# Patient Record
Sex: Male | Born: 1975
Health system: Southern US, Community
[De-identification: ages and names within clinical notes are randomized; demographics above are authoritative.]

## PROBLEM LIST (undated history)

## (undated) DIAGNOSIS — N200 Calculus of kidney: Secondary | ICD-10-CM

## (undated) DIAGNOSIS — Z720 Tobacco use: Secondary | ICD-10-CM

## (undated) DIAGNOSIS — F1010 Alcohol abuse, uncomplicated: Secondary | ICD-10-CM

## (undated) DIAGNOSIS — F32A Depression, unspecified: Secondary | ICD-10-CM

## (undated) HISTORY — PX: KIDNEY STONE SURGERY: SHX686

## (undated) HISTORY — DX: Alcohol abuse, uncomplicated: F10.10

## (undated) HISTORY — DX: Tobacco use: Z72.0

## (undated) HISTORY — DX: Depression, unspecified: F32.A

---

## 2015-08-18 ENCOUNTER — Ambulatory Visit (INDEPENDENT_AMBULATORY_CARE_PROVIDER_SITE_OTHER): Payer: BLUE CROSS/BLUE SHIELD | Admitting: Family Medicine

## 2015-08-18 ENCOUNTER — Encounter: Payer: Self-pay | Admitting: Family Medicine

## 2015-08-18 VITALS — BP 132/82 | HR 87 | Wt 159.0 lb

## 2015-08-18 DIAGNOSIS — F329 Major depressive disorder, single episode, unspecified: Secondary | ICD-10-CM

## 2015-08-18 DIAGNOSIS — F32A Depression, unspecified: Secondary | ICD-10-CM | POA: Insufficient documentation

## 2015-08-18 DIAGNOSIS — Z23 Encounter for immunization: Secondary | ICD-10-CM | POA: Diagnosis not present

## 2015-08-18 DIAGNOSIS — F325 Major depressive disorder, single episode, in full remission: Secondary | ICD-10-CM | POA: Insufficient documentation

## 2015-08-18 DIAGNOSIS — F411 Generalized anxiety disorder: Secondary | ICD-10-CM | POA: Insufficient documentation

## 2015-08-18 MED ORDER — SERTRALINE HCL 25 MG PO TABS: ORAL_TABLET | ORAL | Status: DC

## 2015-08-18 MED ORDER — TRAZODONE HCL 50 MG PO TABS: 25.0000 mg | ORAL_TABLET | Freq: Every evening | ORAL | Status: DC | PRN

## 2015-08-18 NOTE — Progress Notes (Signed)
       Adam Norton is a 40 y.o. male who presents to [name]: Primary Care today for establish care and discuss anxiety and depression. Patient is having significant marital difficulties and over the past month or so has been feeling very anxious and depressed. He's been seeing a therapist recommends starting medications. He denies any SI or HI. He has never had problems with depression or anxiety before. Additionally he notes significant difficulty sleeping. He has a history of alcohol problems in the past but does not drink currently. He denies any other substance use. No fevers chills nausea vomiting diarrhea chest pain palpitations or shortness of breath.   History reviewed. No pertinent past medical history. Past Surgical History  Procedure Laterality Date  . Kidney stone surgery     Social History  Substance Use Topics  . Smoking status: Former Games developer  . Smokeless tobacco: Not on file  . Alcohol Use: 0.0 oz/week    0 Standard drinks or equivalent per week   family history includes Alcohol abuse in his brother and father; Diabetes in his father.  ROS as above Medications: Current Outpatient Prescriptions  Medication Sig Dispense Refill  . sertraline (ZOLOFT) 25 MG tablet 1 pill by mouth daily for 1 week then 2 pills by mouth daily until follow-up 30 tablet 0  . traZODone (DESYREL) 50 MG tablet Take 0.5-1 tablets (25-50 mg total) by mouth at bedtime as needed for sleep. 30 tablet 0   No current facility-administered medications for this visit.   No Known Allergies   Exam:  BP 132/82 mmHg  Pulse 87  Wt 159 lb (72.122 kg) Gen: Well NAD HEENT: EOMI,  MMM Lungs: Normal work of breathing. CTABL Heart: RRR no MRG Abd: NABS, Soft. Nondistended, Nontender Exts: Brisk capillary refill, warm and well perfused.  Psych: Alert and oriented normal speech thought process affect. No SI or HI  expressed.  PHQ9 is 9 GAD 7 is 8  No results found for this or any previous visit (from the past 24 hour(s)). No results found.   Tdap given prior to discharge.  Please see individual assessment and plan sections.

## 2015-08-18 NOTE — Addendum Note (Signed)
Addended by: Minna Antis T on: 08/18/2015 05:19 PM   Modules accepted: Orders

## 2015-08-18 NOTE — Assessment & Plan Note (Signed)
Start Zoloft follow-up one week. Check routine fasting blood work

## 2015-08-18 NOTE — Patient Instructions (Signed)
Thank you for coming in today. Return in one week Start taking Zoloft. Take 1 pill by mouth daily. In one week increase to 2 pills by mouth daily. Take trazodone at bedtime as needed for insomnia or difficulty sleeping. Continue seeing your therapist Get fasting labs in the near future Call if you get worse  Major Depressive Disorder Major depressive disorder is a mental illness. It also may be called clinical depression or unipolar depression. Major depressive disorder usually causes feelings of sadness, hopelessness, or helplessness. Some people with this disorder do not feel particularly sad but lose interest in doing things they used to enjoy (anhedonia). Major depressive disorder also can cause physical symptoms. It can interfere with work, school, relationships, and other normal everyday activities. The disorder varies in severity but is longer lasting and more serious than the sadness we all feel from time to time in our lives. Major depressive disorder often is triggered by stressful life events or major life changes. Examples of these triggers include divorce, loss of your job or home, a move, and the death of a family member or close friend. Sometimes this disorder occurs for no obvious reason at all. People who have family members with major depressive disorder or bipolar disorder are at higher risk for developing this disorder, with or without life stressors. Major depressive disorder can occur at any age. It may occur just once in your life (single episode major depressive disorder). It may occur multiple times (recurrent major depressive disorder). SYMPTOMS People with major depressive disorder have either anhedonia or depressed mood on nearly a daily basis for at least 2 weeks or longer. Symptoms of depressed mood include:  Feelings of sadness (blue or down in the dumps) or emptiness.  Feelings of hopelessness or helplessness.  Tearfulness or episodes of crying (may be observed by  others).  Irritability (children and adolescents). In addition to depressed mood or anhedonia or both, people with this disorder have at least four of the following symptoms:  Difficulty sleeping or sleeping too much.   Significant change (increase or decrease) in appetite or weight.   Lack of energy or motivation.  Feelings of guilt and worthlessness.   Difficulty concentrating, remembering, or making decisions.  Unusually slow movement (psychomotor retardation) or restlessness (as observed by others).   Recurrent wishes for death, recurrent thoughts of self-harm (suicide), or a suicide attempt. People with major depressive disorder commonly have persistent negative thoughts about themselves, other people, and the world. People with severe major depressive disorder may experiencedistorted beliefs or perceptions about the world (psychotic delusions). They also may see or hear things that are not real (psychotic hallucinations). DIAGNOSIS Major depressive disorder is diagnosed through an assessment by your health care provider. Your health care provider will ask aboutaspects of your daily life, such as mood,sleep, and appetite, to see if you have the diagnostic symptoms of major depressive disorder. Your health care provider may ask about your medical history and use of alcohol or drugs, including prescription medicines. Your health care provider also may do a physical exam and blood work. This is because certain medical conditions and the use of certain substances can cause major depressive disorder-like symptoms (secondary depression). Your health care provider also may refer you to a mental health specialist for further evaluation and treatment. TREATMENT It is important to recognize the symptoms of major depressive disorder and seek treatment. The following treatments can be prescribed for this disorder:   Medicine. Antidepressant medicines usually are prescribed. Antidepressant  medicines are thought to correct chemical imbalances in the brain that are commonly associated with major depressive disorder. Other types of medicine may be added if the symptoms do not respond to antidepressant medicines alone or if psychotic delusions or hallucinations occur.  Talk therapy. Talk therapy can be helpful in treating major depressive disorder by providing support, education, and guidance. Certain types of talk therapy also can help with negative thinking (cognitive behavioral therapy) and with relationship issues that trigger this disorder (interpersonal therapy). A mental health specialist can help determine which treatment is best for you. Most people with major depressive disorder do well with a combination of medicine and talk therapy. Treatments involving electrical stimulation of the brain can be used in situations with extremely severe symptoms or when medicine and talk therapy do not work over time. These treatments include electroconvulsive therapy, transcranial magnetic stimulation, and vagal nerve stimulation.   This information is not intended to replace advice given to you by your health care provider. Make sure you discuss any questions you have with your health care provider.   Document Released: 10/30/2012 Document Revised: 07/26/2014 Document Reviewed: 10/30/2012 Elsevier Interactive Patient Education [employer]

## 2015-08-22 LAB — CBC
HCT: 42.3 % (ref 39.0–52.0)
HEMOGLOBIN: 13.8 g/dL (ref 13.0–17.0)
MCH: 28.2 pg (ref 26.0–34.0)
MCHC: 32.6 g/dL (ref 30.0–36.0)
MCV: 86.5 fL (ref 78.0–100.0)
MPV: 8.6 fL (ref 8.6–12.4)
Platelets: 502 10*3/uL — ABNORMAL HIGH (ref 150–400)
RBC: 4.89 MIL/uL (ref 4.22–5.81)
RDW: 14.5 % (ref 11.5–15.5)
WBC: 7.9 10*3/uL (ref 4.0–10.5)

## 2015-08-23 LAB — COMPREHENSIVE METABOLIC PANEL
ALBUMIN: 4.5 g/dL (ref 3.6–5.1)
ALT: 15 U/L (ref 9–46)
AST: 16 U/L (ref 10–40)
Alkaline Phosphatase: 76 U/L (ref 40–115)
BILIRUBIN TOTAL: 0.5 mg/dL (ref 0.2–1.2)
BUN: 19 mg/dL (ref 7–25)
CO2: 26 mmol/L (ref 20–31)
CREATININE: 0.81 mg/dL (ref 0.60–1.35)
Calcium: 9.7 mg/dL (ref 8.6–10.3)
Chloride: 103 mmol/L (ref 98–110)
Glucose, Bld: 89 mg/dL (ref 65–99)
Potassium: 4.3 mmol/L (ref 3.5–5.3)
SODIUM: 139 mmol/L (ref 135–146)
TOTAL PROTEIN: 7.5 g/dL (ref 6.1–8.1)

## 2015-08-23 LAB — LIPID PANEL
Cholesterol: 183 mg/dL (ref 125–200)
HDL: 45 mg/dL (ref >=40)
LDL CALC: 120 mg/dL (ref <130)
TRIGLYCERIDES: 89 mg/dL (ref <150)
Total CHOL/HDL Ratio: 4.1 Ratio (ref <=5.0)
VLDL: 18 mg/dL (ref <30)

## 2015-08-23 LAB — VITAMIN D 25 HYDROXY (VIT D DEFICIENCY, FRACTURES): Vit D, 25-Hydroxy: 25 ng/mL — ABNORMAL LOW (ref 30–100)

## 2015-08-23 LAB — TSH: TSH: 0.437 u[IU]/mL (ref 0.350–4.500)

## 2015-08-25 ENCOUNTER — Encounter: Payer: Self-pay | Admitting: Family Medicine

## 2015-08-25 ENCOUNTER — Ambulatory Visit (INDEPENDENT_AMBULATORY_CARE_PROVIDER_SITE_OTHER): Payer: BLUE CROSS/BLUE SHIELD | Admitting: Family Medicine

## 2015-08-25 VITALS — BP 129/67 | HR 88 | Wt 159.0 lb

## 2015-08-25 DIAGNOSIS — F32A Depression, unspecified: Secondary | ICD-10-CM

## 2015-08-25 DIAGNOSIS — F411 Generalized anxiety disorder: Secondary | ICD-10-CM | POA: Diagnosis not present

## 2015-08-25 DIAGNOSIS — G4700 Insomnia, unspecified: Secondary | ICD-10-CM

## 2015-08-25 DIAGNOSIS — F329 Major depressive disorder, single episode, unspecified: Secondary | ICD-10-CM | POA: Diagnosis not present

## 2015-08-25 DIAGNOSIS — F15982 Other stimulant use, unspecified with stimulant-induced sleep disorder: Secondary | ICD-10-CM | POA: Insufficient documentation

## 2015-08-25 MED ORDER — SERTRALINE HCL 50 MG PO TABS: ORAL_TABLET | ORAL | Status: DC

## 2015-08-25 NOTE — Progress Notes (Signed)
Quick Note:  Vitamin D deficiency noted. Take 2000 units of vitamin D daily over-the-counter.   ______ 

## 2015-08-25 NOTE — Patient Instructions (Signed)
Thank you for coming in today. Increase zoloft to 2 pills (50 mg) daily.  Take  daily for 2 weeks then increase to   Return in 3 weeks.  Continue counseling.

## 2015-08-26 NOTE — Progress Notes (Signed)
       Adam Norton is a 40 y.o. male who presents to [name]: Primary Care today for follow-up depression and anxiety. Patient was seen last week complaining of insomnia depression and anxiety. He was advised to counseling and was given a prescription for Zoloft and trazodone. He currently is taking 25 mg of Zoloft and using trazodone at night. He notes his anxiety and depression are only minimally improved. He has attended counseling which is somewhat helpful. He notes his sleep is slightly improved with trazodone. He denies any SI or HI.   No past medical history on file. Past Surgical History  Procedure Laterality Date  . Kidney stone surgery     Social History  Substance Use Topics  . Smoking status: Current Some Day Smoker  . Smokeless tobacco: Not on file  . Alcohol Use: 0.0 oz/week    0 Standard drinks or equivalent per week   family history includes Alcohol abuse in his brother and father; Diabetes in his father.  ROS as above Medications: Current Outpatient Prescriptions  Medication Sig Dispense Refill  . sertraline (ZOLOFT) 50 MG tablet 1 pill by mouth daily for 1 week then 2 pills by mouth daily until follow-up 30 tablet 0  . traZODone (DESYREL) 50 MG tablet Take 0.5-1 tablets (25-50 mg total) by mouth at bedtime as needed for sleep. 30 tablet 0   No current facility-administered medications for this visit.   No Known Allergies   Exam:  BP 129/67 mmHg  Pulse 88  Wt 159 lb (72.122 kg)  SpO2 97% Gen: Well NAD Psych: Alert and oriented normal speech and thought process. Affect is slightly tearful. No SI or HI expressed. GAD7 is 7 PHQ9 is 7.   No results found for this or any previous visit (from the past 24 hour(s)). No results found.   40 year old man with anxiety depression and insomnia. Continue counseling. Increase Zoloft to 50 mg. Self titrated 200 mg. Return in 3  weeks. Discuss suicidal precautions. Return sooner if needed.

## 2015-09-05 ENCOUNTER — Other Ambulatory Visit: Payer: Self-pay | Admitting: Family Medicine

## 2015-09-15 ENCOUNTER — Ambulatory Visit (INDEPENDENT_AMBULATORY_CARE_PROVIDER_SITE_OTHER): Payer: BLUE CROSS/BLUE SHIELD | Admitting: Family Medicine

## 2015-09-15 ENCOUNTER — Encounter: Payer: Self-pay | Admitting: Family Medicine

## 2015-09-15 VITALS — BP 121/71 | HR 66 | Wt 156.0 lb

## 2015-09-15 DIAGNOSIS — G4700 Insomnia, unspecified: Secondary | ICD-10-CM | POA: Diagnosis not present

## 2015-09-15 DIAGNOSIS — F32A Depression, unspecified: Secondary | ICD-10-CM

## 2015-09-15 DIAGNOSIS — F411 Generalized anxiety disorder: Secondary | ICD-10-CM

## 2015-09-15 DIAGNOSIS — F329 Major depressive disorder, single episode, unspecified: Secondary | ICD-10-CM | POA: Diagnosis not present

## 2015-09-15 MED ORDER — TRAZODONE HCL 100 MG PO TABS: 100.0000 mg | ORAL_TABLET | Freq: Every evening | ORAL | Status: DC | PRN

## 2015-09-15 MED ORDER — SERTRALINE HCL 100 MG PO TABS: 100.0000 mg | ORAL_TABLET | Freq: Every day | ORAL | Status: DC

## 2015-09-15 NOTE — Assessment & Plan Note (Signed)
increase trazodone to 100 mg daily at bedtime. If not improved would switch to Ambien CR

## 2015-09-15 NOTE — Progress Notes (Signed)
       Adam Norton is a 40 y.o. male who presents to [name]: Primary Care today for follow-up depression and anxiety and insomnia. About a month ago patient was first seen for severe depression and anxiety associated with insomnia. This is related to the breakup of his marriage. He currently is taking 100 mg of Zoloft and trazodone 50 mg at bedtime. Both medicines are helpful. He notes difficulty staying asleep. He notes continued depression and anxiety symptoms but denies any SI or HI. His wife has formally separated. He received counseling at church.   No past medical history on file. Past Surgical History  Procedure Laterality Date  . Kidney stone surgery     Social History  Substance Use Topics  . Smoking status: Current Some Day Smoker  . Smokeless tobacco: Not on file  . Alcohol Use: 0.0 oz/week    0 Standard drinks or equivalent per week   family history includes Alcohol abuse in his brother and father; Diabetes in his father.  ROS as above Medications: Current Outpatient Prescriptions  Medication Sig Dispense Refill  . sertraline (ZOLOFT) 100 MG tablet Take 1 tablet (100 mg total) by mouth daily. 30 tablet 1  . traZODone (DESYREL) 100 MG tablet Take 1 tablet (100 mg total) by mouth at bedtime as needed. for sleep 30 tablet 1   No current facility-administered medications for this visit.   No Known Allergies   Exam:  BP 121/71 mmHg  Pulse 66  Wt 156 lb (70.761 kg) Gen: Well NAD Psych: Alert and oriented normal speech thought process and affect. No SI or HI expressed. GAD 7 is 10 PHQ9 is 9  No results found for this or any previous visit (from the past 24 hour(s)). No results found.   Please see individual assessment and plan sections.

## 2015-09-15 NOTE — Assessment & Plan Note (Signed)
Continue Zoloft 100. Recheck in a few weeks.

## 2015-09-15 NOTE — Assessment & Plan Note (Signed)
Continue Zoloft 100. Recheck in a few weeks. 

## 2015-09-15 NOTE — Patient Instructions (Signed)
Thank you for coming in today. Take zoloft .  Take trazodone 50-100mg  at bedtime.  Return in 2-3 weeks.  Continue therapy.   Generalized Anxiety Disorder Generalized anxiety disorder (GAD) is a mental disorder. It interferes with life functions, including relationships, work, and school. GAD is different from normal anxiety, which everyone experiences at some point in their lives in response to specific life events and activities. Normal anxiety actually helps [place] prepare for and get through these life events and activities. Normal anxiety goes away after the event or activity is over.  GAD causes anxiety that is not necessarily related to specific events or activities. It also causes excess anxiety in proportion to specific events or activities. The anxiety associated with GAD is also difficult to control. GAD can vary from mild to severe. People with severe GAD can have intense waves of anxiety with physical symptoms (panic attacks).  SYMPTOMS The anxiety and worry associated with GAD are difficult to control. This anxiety and worry are related to many life events and activities and also occur more days than not for 6 months or longer. People with GAD also have three or more of the following symptoms (one or more in children):  Restlessness.   Fatigue.  Difficulty concentrating.   Irritability.  Muscle tension.  Difficulty sleeping or unsatisfying sleep. DIAGNOSIS GAD is diagnosed through an assessment by your health care provider. Your health care provider will ask you questions aboutyour mood,physical symptoms, and events in your life. Your health care provider may ask you about your medical history and use of alcohol or drugs, including prescription medicines. Your health care provider may also do a physical exam and blood tests. Certain medical conditions and the use of certain substances can cause symptoms similar to those associated with GAD. Your health care provider may  refer you to a mental health specialist for further evaluation. TREATMENT The following therapies are usually used to treat GAD:   Medication. Antidepressant medication usually is prescribed for long-term daily control. Antianxiety medicines may be added in severe cases, especially when panic attacks occur.   Talk therapy (psychotherapy). Certain types of talk therapy can be helpful in treating GAD by providing support, education, and guidance. A form of talk therapy called cognitive behavioral therapy can teach you healthy ways to think about and react to daily life events and activities.  Stress managementtechniques. These include yoga, meditation, and exercise and can be very helpful when they are practiced regularly. A mental health specialist can help determine which treatment is best for you. Some people see improvement with one therapy. However, other people require a combination of therapies.   This information is not intended to replace advice given to you by your health care provider. Make sure you discuss any questions you have with your health care provider.   Document Released: 10/30/2012 Document Revised: 07/26/2014 Document Reviewed: 10/30/2012 Elsevier Interactive Patient Education [employer]

## 2015-09-29 ENCOUNTER — Encounter: Payer: Self-pay | Admitting: Family Medicine

## 2015-09-29 ENCOUNTER — Ambulatory Visit (INDEPENDENT_AMBULATORY_CARE_PROVIDER_SITE_OTHER): Payer: BLUE CROSS/BLUE SHIELD | Admitting: Family Medicine

## 2015-09-29 VITALS — BP 138/84 | HR 74 | Wt 161.0 lb

## 2015-09-29 DIAGNOSIS — F411 Generalized anxiety disorder: Secondary | ICD-10-CM

## 2015-09-29 DIAGNOSIS — F32A Depression, unspecified: Secondary | ICD-10-CM

## 2015-09-29 DIAGNOSIS — G4700 Insomnia, unspecified: Secondary | ICD-10-CM | POA: Diagnosis not present

## 2015-09-29 DIAGNOSIS — F329 Major depressive disorder, single episode, unspecified: Secondary | ICD-10-CM | POA: Diagnosis not present

## 2015-09-29 MED ORDER — TRAZODONE HCL 100 MG PO TABS
100.0000 mg | ORAL_TABLET | Freq: Every evening | ORAL | Status: DC | PRN
Start: 2015-09-29 — End: 2016-01-13

## 2015-09-29 MED ORDER — SERTRALINE HCL 100 MG PO TABS: 100.0000 mg | ORAL_TABLET | Freq: Every day | ORAL | Status: DC

## 2015-09-29 NOTE — Progress Notes (Signed)
       Adam Norton is a 40 y.o. male who [facility] [place] for follow-up depression and anxiety and insomnia. Patient continues to slowly improve. His marriage however continues to worsen. His wife has obtained a separation agreement and will be moving out soon. He continues to receive counseling through church. He understands his marriages ended and has accepted it. He notes that his sleep has also improved with trazodone 100 mg as needed at night. He denies any SI or HI.   No past medical history on file. Past Surgical History  Procedure Laterality Date  . Kidney stone surgery     Social History  Substance Use Topics  . Smoking status: Current Some Day Smoker  . Smokeless tobacco: Not on file  . Alcohol Use: 0.0 oz/week    0 Standard drinks or equivalent per week   family history includes Alcohol abuse in his brother and father; Diabetes in his father.  ROS as above Medications: Current Outpatient Prescriptions  Medication Sig Dispense Refill  . sertraline (ZOLOFT) 100 MG tablet Take 1 tablet (100 mg total) by mouth daily. 90 tablet 0  . traZODone (DESYREL) 100 MG tablet Take 1 tablet (100 mg total) by mouth at bedtime as needed. for sleep 90 tablet 0   No current facility-administered medications for this visit.   No Known Allergies   Exam:  BP 138/84 mmHg  Pulse 74  Wt 161 lb (73.029 kg) Gen: Well NAD Psych: Alert and oriented normal speech thought process and affect.  GAD7 is 6 PHQ9 is 5  No results found for this or any previous visit (from the past 24 hour(s)). No results found.   Please see individual assessment and plan sections.

## 2015-09-29 NOTE — Assessment & Plan Note (Signed)
Improved. Continue trazodone 100. Recheck in one month.

## 2015-09-29 NOTE — Assessment & Plan Note (Signed)
Improved. Continue Zoloft 100. Continue counseling. Recheck in one month

## 2015-09-29 NOTE — Patient Instructions (Signed)
Thank you for coming in today. Return in 1 month.  Return sooner if needed.  

## 2015-09-29 NOTE — Assessment & Plan Note (Signed)
Improved. Continue Zoloft and counseling. Recheck in one month

## 2015-10-21 DIAGNOSIS — F4323 Adjustment disorder with mixed anxiety and depressed mood: Secondary | ICD-10-CM | POA: Diagnosis not present

## 2015-10-27 ENCOUNTER — Encounter: Payer: Self-pay | Admitting: Family Medicine

## 2015-10-27 ENCOUNTER — Ambulatory Visit (INDEPENDENT_AMBULATORY_CARE_PROVIDER_SITE_OTHER): Payer: BLUE CROSS/BLUE SHIELD | Admitting: Family Medicine

## 2015-10-27 VITALS — BP 116/75 | HR 77 | Wt 165.0 lb

## 2015-10-27 DIAGNOSIS — G4700 Insomnia, unspecified: Secondary | ICD-10-CM

## 2015-10-27 DIAGNOSIS — F411 Generalized anxiety disorder: Secondary | ICD-10-CM

## 2015-10-27 DIAGNOSIS — F329 Major depressive disorder, single episode, unspecified: Secondary | ICD-10-CM | POA: Diagnosis not present

## 2015-10-27 DIAGNOSIS — F32A Depression, unspecified: Secondary | ICD-10-CM

## 2015-10-27 NOTE — Progress Notes (Signed)
       Adam [facility] is a 40 y.o. male who [facility] today for anxiety depression and insomnia. He takes trazodone and Zoloft. He is receiving counseling and doing well. No SI or HI. His wife is officially separated and they are living apartment.   No past medical history on file. Past Surgical History  Procedure Laterality Date  . Kidney stone surgery     Social History  Substance Use Topics  . Smoking status: Current Some Day Smoker  . Smokeless tobacco: Not on file  . Alcohol Use: 0.0 oz/week    0 Standard drinks or equivalent per week   family history includes Alcohol abuse in his brother and father; Diabetes in his father.  ROS as above Medications: Current Outpatient Prescriptions  Medication Sig Dispense Refill  . sertraline (ZOLOFT) 100 MG tablet Take 1 tablet (100 mg total) by mouth daily. 90 tablet 0  . traZODone (DESYREL) 100 MG tablet Take 1 tablet (100 mg total) by mouth at bedtime as needed. for sleep 90 tablet 0   No current facility-administered medications for this visit.   No Known Allergies   Exam:  BP 116/75 mmHg  Pulse 77  Wt 165 lb (74.844 kg) Gen: Well NAD Psych: Alert and oriented normal affect attention thought process. No SI or HI expressed.  PHQ9 is 5 GAD 7 is 5.  No results found for this or any previous visit (from the past 24 hour(s)). No results found.     40 year old man with anxiety depression and insomnia. Doing reasonably well. Continue current regimen including counseling. Recheck in no longer than 3 months.

## 2015-10-27 NOTE — Patient Instructions (Signed)
Thank you for coming in today. Return in 3 months or sooner if needed.  

## 2015-10-28 DIAGNOSIS — F4323 Adjustment disorder with mixed anxiety and depressed mood: Secondary | ICD-10-CM | POA: Diagnosis not present

## 2015-11-03 DIAGNOSIS — F4323 Adjustment disorder with mixed anxiety and depressed mood: Secondary | ICD-10-CM | POA: Diagnosis not present

## 2015-11-11 DIAGNOSIS — F4323 Adjustment disorder with mixed anxiety and depressed mood: Secondary | ICD-10-CM | POA: Diagnosis not present

## 2015-11-18 DIAGNOSIS — F4323 Adjustment disorder with mixed anxiety and depressed mood: Secondary | ICD-10-CM | POA: Diagnosis not present

## 2015-11-25 DIAGNOSIS — F4323 Adjustment disorder with mixed anxiety and depressed mood: Secondary | ICD-10-CM | POA: Diagnosis not present

## 2015-12-02 DIAGNOSIS — F4323 Adjustment disorder with mixed anxiety and depressed mood: Secondary | ICD-10-CM | POA: Diagnosis not present

## 2015-12-09 DIAGNOSIS — F4323 Adjustment disorder with mixed anxiety and depressed mood: Secondary | ICD-10-CM | POA: Diagnosis not present

## 2015-12-16 DIAGNOSIS — F4323 Adjustment disorder with mixed anxiety and depressed mood: Secondary | ICD-10-CM | POA: Diagnosis not present

## 2015-12-17 DIAGNOSIS — F4323 Adjustment disorder with mixed anxiety and depressed mood: Secondary | ICD-10-CM | POA: Diagnosis not present

## 2015-12-23 DIAGNOSIS — F4323 Adjustment disorder with mixed anxiety and depressed mood: Secondary | ICD-10-CM | POA: Diagnosis not present

## 2015-12-30 DIAGNOSIS — F4323 Adjustment disorder with mixed anxiety and depressed mood: Secondary | ICD-10-CM | POA: Diagnosis not present

## 2016-01-05 ENCOUNTER — Ambulatory Visit (INDEPENDENT_AMBULATORY_CARE_PROVIDER_SITE_OTHER): Payer: BLUE CROSS/BLUE SHIELD | Admitting: Sports Medicine

## 2016-01-05 ENCOUNTER — Ambulatory Visit (INDEPENDENT_AMBULATORY_CARE_PROVIDER_SITE_OTHER): Payer: BLUE CROSS/BLUE SHIELD

## 2016-01-05 ENCOUNTER — Encounter: Payer: Self-pay | Admitting: Sports Medicine

## 2016-01-05 VITALS — BP 116/69 | HR 84 | Wt 168.0 lb

## 2016-01-05 DIAGNOSIS — M25512 Pain in left shoulder: Secondary | ICD-10-CM

## 2016-01-05 MED ORDER — MELOXICAM 15 MG PO TABS: ORAL_TABLET | ORAL | Status: DC

## 2016-01-05 NOTE — Progress Notes (Signed)
   Subjective:    I'm seeing this patient as a consultation for:  Dr. Clementeen GrahamEvan Corey  CC: Left shoulder pain  HPI: For the past several weeks this pleasant 40 year old male has had pain that he localizes over the lateral aspect of the left shoulder, worse with overhead activities and external rotation. No trauma, waking him from sleep, nothing radicular.  Past medical history, Surgical history, Family history not pertinant except as noted below, Social history, Allergies, and medications have been entered into the medical record, reviewed, and no changes needed.   Review of Systems: No headache, visual changes, nausea, vomiting, diarrhea, constipation, dizziness, abdominal pain, skin rash, fevers, chills, night sweats, weight loss, swollen lymph nodes, body aches, joint swelling, muscle aches, chest pain, shortness of breath, mood changes, visual or auditory hallucinations.   Objective:   General: Well Developed, well nourished, and in no acute distress.  Neuro/Psych: Alert and oriented x3, extra-ocular muscles intact, able to move all 4 extremities, sensation grossly intact. Skin: Warm and dry, no rashes noted.  Respiratory: Not using accessory muscles, speaking in full sentences, trachea midline.  Cardiovascular: Pulses palpable, no extremity edema. Abdomen: Does not appear distended. Left Shoulder: Inspection reveals no abnormalities, atrophy or asymmetry. Palpation is normal with no tenderness over AC joint or bicipital groove. ROM is full in all planes. Rotator cuff strength okay with the exception of infraspinatus, weak external rotation Positive Neer and Hawkin's tests, empty can. Speeds and Yergason's tests normal. No labral pathology noted with negative Obrien's, negative crank, negative clunk, and good stability. Normal scapular function observed. No painful arc and no drop arm sign. No apprehension sign  Procedure: Real-time Ultrasound Guided Injection of left subacromial  bursa Device: GE Logiq E  Verbal informed consent obtained.  Time-out conducted.  Noted no overlying erythema, induration, or other signs of local infection.  Skin prepped in a sterile fashion.  Local anesthesia: Topical Ethyl chloride.  With sterile technique and under real time ultrasound guidance:  Using a posterior approach I guided the needle just superficial to the infraspinatus and deep to the deltoid, injected 1 mL kenalog 40, 1 mL lidocaine, 1 mL Marcaine. Completed without difficulty  Pain immediately resolved suggesting accurate placement of the medication.  Advised to call if fevers/chills, erythema, induration, drainage, or persistent bleeding.  Images permanently stored and available for review in the ultrasound unit.  Impression: Technically successful ultrasound guided injection.  Impression and Recommendations:   This case required medical decision making of moderate complexity.

## 2016-01-05 NOTE — Assessment & Plan Note (Signed)
Pain isolated to left infraspinatus, injection as above, formal physical therapy, x-rays, meloxicam, return to see Dr. Denyse Amassorey in approximately 6 weeks.

## 2016-01-06 DIAGNOSIS — F4323 Adjustment disorder with mixed anxiety and depressed mood: Secondary | ICD-10-CM | POA: Diagnosis not present

## 2016-01-12 ENCOUNTER — Ambulatory Visit (INDEPENDENT_AMBULATORY_CARE_PROVIDER_SITE_OTHER): Payer: BLUE CROSS/BLUE SHIELD | Admitting: Rehabilitative and Restorative Service Providers"

## 2016-01-12 ENCOUNTER — Encounter: Payer: Self-pay | Admitting: Rehabilitative and Restorative Service Providers"

## 2016-01-12 DIAGNOSIS — M25512 Pain in left shoulder: Secondary | ICD-10-CM | POA: Diagnosis not present

## 2016-01-12 DIAGNOSIS — R29898 Other symptoms and signs involving the musculoskeletal system: Secondary | ICD-10-CM

## 2016-01-12 DIAGNOSIS — M791 Myalgia, unspecified site: Secondary | ICD-10-CM

## 2016-01-12 DIAGNOSIS — M5412 Radiculopathy, cervical region: Secondary | ICD-10-CM

## 2016-01-12 NOTE — Patient Instructions (Signed)
Extensors, Supine    Lie supine, head on small, rolled towel. Gently tuck chin and bring toward chest. Hold _10__ seconds. Repeat __5-10_ times per session. Do _several __ sessions per day.  Axial Extension (Chin Tuck)    Pull chin in and lengthen back of neck. Hold __10__ seconds while counting out loud. Repeat __-10__ times. Do _several _ sessions per day.   Shoulder Blade Squeeze    Rotate shoulders back, then squeeze shoulder blades down and back. Hold 10 sec Repeat _10___ times. Do _several ___ sessions per day. Can use swim noodle for tactile cue   Scapula Adduction With Pectoralis Stretch: Low - Standing   Shoulders at 45 hands even with shoulders, keeping weight through legs, shift weight forward until you feel pull or stretch through the front of your chest. Hold _30__ seconds. Do _3__ times, _2-4__ times per day.   Scapula Adduction With Pectoralis Stretch: Mid-Range - Standing   Shoulders at 90 elbows even with shoulders, keeping weight through legs, shift weight forward until you feel pull or strength through the front of your chest. Hold __30_ seconds. Do _3__ times, __2-4_ times per day.   Scapula Adduction With Pectoralis Stretch: High - Standing   Shoulders at 120 hands up high on the doorway, keeping weight on feet, shift weight forward until you feel pull or stretch through the front of your chest. Hold _30__ seconds. Do _3__ times, _2-3__ times per day.   Self massage using 4 inch rubber ball back to wall and facing away from wall - pressure as tolerated   TENS UNIT: This is helpful for muscle pain and spasm.   Search and Purchase a TENS 7000 2nd edition at www.tenspros.com. It should be less than $30.     TENS unit instructions: Do not shower or bathe with the unit on Turn the unit off before removing electrodes or batteries If the electrodes lose stickiness add a drop of water to the electrodes after they are disconnected from the unit  and place on plastic sheet. If you continued to have difficulty, call the TENS unit company to purchase more electrodes. Do not apply lotion on the skin area prior to use. Make sure the skin is clean and dry as this will help prolong the life of the electrodes. After use, always check skin for unusual red areas, rash or other skin difficulties. If there are any skin problems, does not apply electrodes to the same area. Never remove the electrodes from the unit by pulling the wires. Do not use the TENS unit or electrodes other than as directed. Do not change electrode placement without consultating your therapist or physician. Keep 2 fingers with between each electrode.
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
## 2016-01-13 ENCOUNTER — Ambulatory Visit (INDEPENDENT_AMBULATORY_CARE_PROVIDER_SITE_OTHER): Payer: BLUE CROSS/BLUE SHIELD | Admitting: Family Medicine

## 2016-01-13 ENCOUNTER — Encounter: Payer: Self-pay | Admitting: Family Medicine

## 2016-01-13 ENCOUNTER — Ambulatory Visit (INDEPENDENT_AMBULATORY_CARE_PROVIDER_SITE_OTHER): Payer: BLUE CROSS/BLUE SHIELD

## 2016-01-13 VITALS — BP 118/71 | HR 76 | Wt 165.0 lb

## 2016-01-13 DIAGNOSIS — M50322 Other cervical disc degeneration at C5-C6 level: Secondary | ICD-10-CM

## 2016-01-13 DIAGNOSIS — F329 Major depressive disorder, single episode, unspecified: Secondary | ICD-10-CM

## 2016-01-13 DIAGNOSIS — M5412 Radiculopathy, cervical region: Secondary | ICD-10-CM

## 2016-01-13 DIAGNOSIS — M4802 Spinal stenosis, cervical region: Secondary | ICD-10-CM

## 2016-01-13 DIAGNOSIS — M47812 Spondylosis without myelopathy or radiculopathy, cervical region: Secondary | ICD-10-CM | POA: Insufficient documentation

## 2016-01-13 DIAGNOSIS — F4323 Adjustment disorder with mixed anxiety and depressed mood: Secondary | ICD-10-CM | POA: Diagnosis not present

## 2016-01-13 DIAGNOSIS — M25512 Pain in left shoulder: Secondary | ICD-10-CM | POA: Diagnosis not present

## 2016-01-13 DIAGNOSIS — F411 Generalized anxiety disorder: Secondary | ICD-10-CM

## 2016-01-13 DIAGNOSIS — F32A Depression, unspecified: Secondary | ICD-10-CM

## 2016-01-13 MED ORDER — PREDNISONE 5 MG (48) PO TBPK: ORAL_TABLET | ORAL | Status: DC

## 2016-01-13 MED ORDER — TRAZODONE HCL 100 MG PO TABS: 100.0000 mg | ORAL_TABLET | Freq: Every evening | ORAL | Status: DC | PRN

## 2016-01-13 MED ORDER — SERTRALINE HCL 100 MG PO TABS: 100.0000 mg | ORAL_TABLET | Freq: Every day | ORAL | Status: DC

## 2016-01-13 NOTE — Progress Notes (Signed)
Adam Norton is a 40 y.o. male who [name] today for left shoulder pain. Patient has a one-week history of left shoulder pain. He notes he felt a pop while lifting an object at work. He was seen by my colleague Dr. Karie Schwalbe on the 19th. He received a subacromial bursa injection which helped for only a few days. He notes continued mild left shoulder pain worse with overhead motion reaching back. However he notes significant pain radiating from his neck to the radial side of his left hand. His radiating pain is worse with neck motion. He denies any weakness or numbness or loss of function. He had a trial of meloxicam which has not helped. He's had one session of physical therapy which has helped a bit for his shoulder. No fevers or chills nausea vomiting or diarrhea.  Additionally patient notes continued anxiety and depression. He tolerates Zoloft and trazodone well. He continues to receive counseling through church. Life is not great but is okay. He continues to grieve over the loss of his marriage.   No past medical history on file. Past Surgical History  Procedure Laterality Date  . Kidney stone surgery     Social History  Substance Use Topics  . Smoking status: Current Some Day Smoker  . Smokeless tobacco: Not on file  . Alcohol Use: 0.0 oz/week    0 Standard drinks or equivalent per week   family history includes Alcohol abuse in his brother and father; Diabetes in his father.  ROS as above:  Medications: Current Outpatient Prescriptions  Medication Sig Dispense Refill  . meloxicam (MOBIC) 15 MG tablet One tab PO qAM with breakfast for 2 weeks, then daily prn pain. 30 tablet 3  . sertraline (ZOLOFT) 100 MG tablet Take 1 tablet (100 mg total) by mouth daily. 90 tablet 2  . traZODone (DESYREL) 100 MG tablet Take 1 tablet (100 mg total) by mouth at bedtime as  needed. for sleep 90 tablet 2  . predniSONE (STERAPRED UNI-PAK 48 TAB) 5 MG (48) TBPK tablet 12 day dosepack po 48 tablet 0   No current facility-administered medications for this visit.   No Known Allergies   Exam:  BP 118/71 mmHg  Pulse 76  Wt 165 lb (74.844 kg) Gen: Well NAD Neck: Nontender to midline. Normal neck motion. Positive left-sided Spurling's test Upper extremity strength is equal and normal throughout. Pulses capillary refill and sensation intact distally bilaterally Left shoulder normal-appearing nontender normal motion pain with abduction arc Negative Hawkins and Neer's test. Positive empty can test. Palpable click with shoulder motion. Negative grind test negative clunk test Negative Yergason's and speeds test  Psych: Alert and oriented normal speech thought process and affect.  X-ray left shoulder dated 01/05/2016 reviewed X-ray C-spine pending   No results found for this or any previous visit (from the past 24 hour(s)). No results found.    Assessment and Plan: 40 y.o. male with  1) cervical radiculopathy Issue today. Unclear etiology. Patient certainly seems to have C6 left-sided cervical radiculopathy. Plan for x-ray prednisone Dosepak and physical therapy for this issue. Recheck in a few weeks. Work note extended for 4 weeks light duty no lifting more than 10 pounds. 2) left shoulder pain: Patient may have rotator cuff tendinitis versus glenohumeral problem. This seems to be more minor issue today. Continue physical therapy recheck in a few weeks. 3) inciting depression: Doing reasonably well continued counseling refill Zoloft  and trazodone.  Discussed warning signs or symptoms. Please see discharge instructions. Patient expresses understanding.

## 2016-01-13 NOTE — Patient Instructions (Signed)
Thank you for coming in today. Attend PT.  Take prednisone.  STOP meloxicam.  Return in 3 weeks or so.   Cervical Radiculopathy Cervical radiculopathy happens when a nerve in the neck (cervical nerve) is pinched or bruised. This condition can develop because of an injury or as part of the normal aging process. Pressure on the cervical nerves can cause pain or numbness that runs from the neck all the way down into the arm and fingers. Usually, this condition gets better with rest. Treatment may be needed if the condition does not improve.  CAUSES This condition may be caused by:  Injury.  Slipped (herniated) disk.  Muscle tightness in the neck because of overuse.  Arthritis.  Breakdown or degeneration in the bones and joints of the spine (spondylosis) due to aging.  Bone spurs that may develop near the cervical nerves. SYMPTOMS Symptoms of this condition include:  Pain that runs from the neck to the arm and hand. The pain can be severe or irritating. It may be worse when the neck is moved.  Numbness or weakness in the affected arm and hand. DIAGNOSIS This condition may be diagnosed based on symptoms, medical history, and a physical exam. You may also have tests, including:  X-rays.  CT scan.  MRI.  Electromyogram (EMG).  Nerve conduction tests. TREATMENT In many cases, treatment is not needed for this condition. With rest, the condition usually gets better over time. If treatment is needed, options may include:  Wearing a soft neck collar for short periods of time.  Physical therapy to strengthen your neck muscles.  Medicines, such as NSAIDs, oral corticosteroids, or spinal injections.  Surgery. This may be needed if other treatments do not help. Various types of surgery may be done depending on the cause of your problems. HOME CARE INSTRUCTIONS Managing Pain  Take over-the-counter and prescription medicines only as told by your health care provider.  If  directed, apply ice to the affected area.  Put ice in a plastic bag.  Place a towel between your skin and the bag.  Leave the ice on for 20 minutes, 2-3 times per day.  If ice does not help, you can try using heat. Take a warm shower or warm bath, or use a heat pack as told by your health care provider.  Try a gentle neck and shoulder massage to help relieve symptoms. Activity  Rest as needed. Follow instructions from your health care provider about any restrictions on activities.  Do stretching and strengthening exercises as told by your health care provider or physical therapist. General Instructions  If you were given a soft collar, wear it as told by your health care provider.  Use a flat pillow when you sleep.  Keep all follow-up visits as told by your health care provider. This is important. SEEK MEDICAL CARE IF:  Your condition does not improve with treatment. SEEK IMMEDIATE MEDICAL CARE IF:  Your pain gets much worse and cannot be controlled with medicines.  You have weakness or numbness in your hand, arm, face, or leg.  You have a high fever.  You have a stiff, rigid neck.  You lose control of your bowels or your bladder (have incontinence).  You have trouble with walking, balance, or speaking.   This information is not intended to replace advice given to you by your health care provider. Make sure you discuss any questions you have with your health care provider.   Document Released: 03/30/2001 Document Revised: 03/26/2015 Document  Reviewed: 08/29/2014 Elsevier Interactive Patient Education [employer].

## 2016-01-14 NOTE — Progress Notes (Signed)
Quick Note:  You have neck arthritis that makes sense to cause a pinched nerve down your arm.  If not better with prednisone and with Physical Therapy we will get a MRI which will show us the nerves and discs in the neck. ______

## 2016-01-15 ENCOUNTER — Ambulatory Visit (INDEPENDENT_AMBULATORY_CARE_PROVIDER_SITE_OTHER): Payer: BLUE CROSS/BLUE SHIELD | Admitting: Physical Therapy

## 2016-01-15 DIAGNOSIS — M5412 Radiculopathy, cervical region: Secondary | ICD-10-CM

## 2016-01-15 DIAGNOSIS — R29898 Other symptoms and signs involving the musculoskeletal system: Secondary | ICD-10-CM | POA: Diagnosis not present

## 2016-01-15 DIAGNOSIS — M25512 Pain in left shoulder: Secondary | ICD-10-CM

## 2016-01-15 DIAGNOSIS — M791 Myalgia, unspecified site: Secondary | ICD-10-CM

## 2016-01-15 NOTE — Therapy (Addendum)
[place], [place], [address] Phone: [phone]   Fax:  [phone]  Physical Therapy Treatment  Patient Details  Name: Adam Norton MRN: [id number] Date of Birth: [birth year] Referring Provider: Dr. Dianah Field  Encounter Date: 01/15/2016      PT End of Session - 01/15/16 1647    Visit Number 2   Number of Visits 12   Date for PT Re-Evaluation 02/26/16   PT Start Time 6440   PT Stop Time 1751   PT Time Calculation (min) 66 min      No past medical history on file.  Past Surgical History  Procedure Laterality Date  . Kidney stone surgery     Subjective:  Pt reports he had relief in Lt shoulder for rest of day, of last treatment.  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  Continues to have tingling into Lt hand regardless of arm / neck position. Pt has had hard time sleeping.    Pain:  3/10 in Lt shoulder (ant/post); sharp pain. (although facial grimacing during treatment of 8/10).   Relieved with estim/ medicine.  Aggrivated by "everything".        [facility] Adult PT Treatment/Exercise - 01/15/16 0001    Neck Exercises: Standing   Neck Retraction 5 secs  3 reps, with tactile cues   Neck Retraction Limitations unable to tolerate; moved to supine position   Neck Exercises: Supine   Neck Retraction 5 reps;5 secs   Shoulder Exercises: Supine   Other Supine Exercises nerve glides wiht LUE x 10; repeated 5 times with lateral head flexion.    Shoulder Exercises: Seated   Other Seated Exercises shoulder shrugs x 5; shoulder rolls x 5 each way   Shoulder Exercises: Standing   Retraction Both;12 reps  5 sec hold, pool noodle behind back    Shoulder Exercises: ROM/Strengthening   UBE (Upper Arm Bike) L1: 1 min each direction   backward is difficult   Shoulder Exercises: Stretch   Other Shoulder Stretches 2 way doorway stretch 30 sec x 3    Other Shoulder Stretches supine snow  angel position - painful unable to tolerate great abdct than 45 deg due to pain    Modalities   Modalities Vasopneumatic;Ultrasound;Buyer, retail Lt lateral cervical and shoulder    Electrical Stimulation Action IFC   Electrical Stimulation Parameters  to tolerance    Electrical Stimulation Goals Tone;Pain   Ultrasound   Ultrasound Location Lt posterior shoulder   Ultrasound Parameters 50%-100%, 1.0 w/cm2, x 1 min   pt unable to tolerate weight of sound head; stopped.    Ultrasound Goals Pain;Other (Comment)  tightness.    Vasopneumatic   Number Minutes Vasopneumatic  15 minutes   Vasopnuematic Location  Shoulder   Vasopneumatic Pressure Low   Vasopneumatic Temperature  3 snowflakes.    Manual Therapy   Manual Therapy Myofascial release;Soft tissue mobilization;Taping   Manual therapy comments Rock tape applied to posterior shoulder muscles to decompress tissue, desensitize area, and decrease pain.      Soft tissue mobilization TPR to Lt posterior shoulder girdle, subscap; Edge tool assistance to Lt rhomboid, Terese minor, infraspinatus, upper trap    Myofascial Release Lt pec major, minor; Lt upper trap.    Manual Traction --   Neck Exercises: Stretches   Upper Trapezius Stretch 2 reps;20 seconds  seated          PT  Long Term Goals - 01/12/16 1707    PT LONG TERM GOAL #1   Title Improve posture and alignment with patient to demonstrate upright posture 02/26/16   Time 6   Period Weeks   Status New   PT LONG TERM GOAL #2   Title Decrease tightness to palpation through the cervical and Lt upper quarter 02/26/16   Time 6   Period Weeks   Status New   PT LONG TERM GOAL #3   Title Decrease/eliminate radicular tingling into the Lt UE 02/26/16   Time 6   Period Weeks   Status New   PT LONG TERM GOAL #4   Title Independent in HEP 02/26/16   Time 6   Period Weeks   Status New   PT LONG TERM GOAL #5   Title  Improve FOTO to </= 29% limitation 02/26/16   Time 6   Period Weeks   Status New               Plan - 01/15/16 1742    Clinical Impression Statement Pt had difficulty tolerating exercise, as well as the pressure of [place] head.  Pt very point tender in infraspinatus, teres, pec, and rhomboid on Lt with manual therapy.  No goals met, only 2nd visit.    Rehab Potential Good   PT Frequency 2x / week   PT Duration 6 weeks   PT Treatment/Interventions Patient/family education;ADLs/Self Care Home Management;Cryotherapy;Electrical Stimulation;Iontophoresis 16m/ml Dexamethasone;Moist Heat;Traction;Ultrasound;Neuromuscular re-education;Manual techniques;Dry needling;Therapeutic activities;Therapeutic exercise   PT Next Visit Plan Assess response to Rock tape and vaso vs heat.  Continue postural correction and strengthening. Manual therapy and TDN as indicated.    Consulted and Agree with Plan of Care Patient      Patient will benefit from skilled therapeutic intervention in order to improve the following deficits and impairments:  Postural dysfunction, Improper body mechanics, Pain, Decreased range of motion, Decreased mobility, Increased fascial restricitons, Increased muscle spasms, Decreased activity tolerance  Visit Diagnosis: Pain in left shoulder  Radiculopathy, cervical region  Myalgia  Other symptoms and signs involving the musculoskeletal system     Problem List Patient Active Problem List   Diagnosis Date Noted  . Cervical radiculitis 01/13/2016  . Left shoulder pain 01/05/2016  . Insomnia 08/25/2015  . Generalized anxiety disorder 08/18/2015  . Depression 08/18/2015   JKerin Perna PTA 01/15/2016 6:00 PM  [id number] [address]Phone: [phone]  Fax:  [phone] Name: Adam GwynneMRN: [id number] of Birth: [birth year]

## 2016-01-15 NOTE — Patient Instructions (Signed)
Neurovascular: Median Nerve Glide With Cervical Bias - Supine    Lie with neck supported, right arm out to side, elbow straight, thumb down, fingers and wrist bent back. Slowly move opposite side ear toward shoulder as far as possible without pain. Repeat __10__ times per set. Do __1__ sets per session. Do _5___ sessions per week.

## 2016-01-22 DIAGNOSIS — F4323 Adjustment disorder with mixed anxiety and depressed mood: Secondary | ICD-10-CM | POA: Diagnosis not present

## 2016-01-26 ENCOUNTER — Encounter: Payer: Self-pay | Admitting: Family Medicine

## 2016-01-26 ENCOUNTER — Ambulatory Visit (INDEPENDENT_AMBULATORY_CARE_PROVIDER_SITE_OTHER): Payer: BLUE CROSS/BLUE SHIELD | Admitting: Family Medicine

## 2016-01-26 VITALS — BP 111/72 | HR 80 | Wt 161.0 lb

## 2016-01-26 DIAGNOSIS — M5412 Radiculopathy, cervical region: Secondary | ICD-10-CM | POA: Diagnosis not present

## 2016-01-26 NOTE — Progress Notes (Signed)
       Dorthula RueGregory Soots is a 40 y.o. male who [name] today for follow-up neck pain. Patient was seen a few weeks ago for left-sided neck and shoulder pain radiating to the left hand. He's had a trial of oral steroids and physical therapy and feels no better. He notes symptoms are pretty significant and are interfering with his ability to work. He denies any new weakness or numbness or loss of function. No fevers or chills nausea vomiting or diarrhea.   No past medical history on file. Past Surgical History  Procedure Laterality Date  . Kidney stone surgery     Social History  Substance Use Topics  . Smoking status: Current Some Day Smoker  . Smokeless tobacco: Not on file  . Alcohol Use: 0.0 oz/week    0 Standard drinks or equivalent per week   family history includes Alcohol abuse in his brother and father; Diabetes in his father.  ROS as above:  Medications: Current Outpatient Prescriptions  Medication Sig Dispense Refill  . sertraline (ZOLOFT) 100 MG tablet Take 1 tablet (100 mg total) by mouth daily. 90 tablet 2  . traZODone (DESYREL) 100 MG tablet Take 1 tablet (100 mg total) by mouth at bedtime as needed. for sleep 90 tablet 2   No current facility-administered medications for this visit.   No Known Allergies   Exam:  BP 111/72 mmHg  Pulse 80  Wt 161 lb (73.029 kg) Gen: Well NAD Neck: Nontender to midline. Tender palpation left trapezius Decreased motion due to pain. Positive left-sided Spurling's test Upper extremity strength is equal and normal throughout.  No results found for this or any previous visit (from the past 24 hour(s)). No results found.    Assessment and Plan: 40 y.o. male with cervical radiculopathy. Failed conservative management. Plan for MRI for epidural steroid injection planning.. Follow-up after MRI.  Discussed  warning signs or symptoms. Please see discharge instructions. Patient expresses understanding.

## 2016-01-26 NOTE — Patient Instructions (Signed)
Thank you for coming in today. You should hear about MRI soon.  Call you you never hear anything.

## 2016-01-27 ENCOUNTER — Other Ambulatory Visit: Payer: Self-pay | Admitting: Family Medicine

## 2016-01-31 ENCOUNTER — Ambulatory Visit (HOSPITAL_BASED_OUTPATIENT_CLINIC_OR_DEPARTMENT_OTHER): Payer: BLUE CROSS/BLUE SHIELD

## 2016-02-03 DIAGNOSIS — F4323 Adjustment disorder with mixed anxiety and depressed mood: Secondary | ICD-10-CM | POA: Diagnosis not present

## 2016-02-04 ENCOUNTER — Ambulatory Visit (INDEPENDENT_AMBULATORY_CARE_PROVIDER_SITE_OTHER): Payer: BLUE CROSS/BLUE SHIELD | Admitting: Family Medicine

## 2016-02-04 DIAGNOSIS — Z5329 Procedure and treatment not carried out because of patient's decision for other reasons: Secondary | ICD-10-CM

## 2016-02-04 NOTE — Progress Notes (Signed)
No show. F/u Soon 

## 2016-02-08 ENCOUNTER — Ambulatory Visit (HOSPITAL_BASED_OUTPATIENT_CLINIC_OR_DEPARTMENT_OTHER)
Admission: RE | Admit: 2016-02-08 | Discharge: 2016-02-08 | Disposition: A | Discharge status: Home / Self Care | Payer: BLUE CROSS/BLUE SHIELD | Source: Ambulatory Visit | Attending: Family Medicine | Admitting: Family Medicine

## 2016-02-08 ENCOUNTER — Other Ambulatory Visit: Payer: Self-pay | Admitting: Family Medicine

## 2016-02-08 DIAGNOSIS — M542 Cervicalgia: Secondary | ICD-10-CM | POA: Diagnosis not present

## 2016-02-08 DIAGNOSIS — M4802 Spinal stenosis, cervical region: Secondary | ICD-10-CM | POA: Insufficient documentation

## 2016-02-08 DIAGNOSIS — M5412 Radiculopathy, cervical region: Secondary | ICD-10-CM

## 2016-02-08 DIAGNOSIS — T1590XA Foreign body on external eye, part unspecified, unspecified eye, initial encounter: Secondary | ICD-10-CM

## 2016-02-08 DIAGNOSIS — M1288 Other specific arthropathies, not elsewhere classified, other specified site: Secondary | ICD-10-CM | POA: Diagnosis not present

## 2016-02-08 DIAGNOSIS — Z01818 Encounter for other preprocedural examination: Secondary | ICD-10-CM | POA: Diagnosis not present

## 2016-02-10 ENCOUNTER — Encounter: Payer: Self-pay | Admitting: Family Medicine

## 2016-02-10 ENCOUNTER — Ambulatory Visit (INDEPENDENT_AMBULATORY_CARE_PROVIDER_SITE_OTHER): Payer: BLUE CROSS/BLUE SHIELD | Admitting: Family Medicine

## 2016-02-10 VITALS — BP 123/74 | HR 93 | Wt 168.0 lb

## 2016-02-10 DIAGNOSIS — M1288 Other specific arthropathies, not elsewhere classified, other specified site: Secondary | ICD-10-CM

## 2016-02-10 DIAGNOSIS — M7552 Bursitis of left shoulder: Secondary | ICD-10-CM | POA: Insufficient documentation

## 2016-02-10 DIAGNOSIS — M71012 Abscess of bursa, left shoulder: Secondary | ICD-10-CM | POA: Insufficient documentation

## 2016-02-10 DIAGNOSIS — G5602 Carpal tunnel syndrome, left upper limb: Secondary | ICD-10-CM | POA: Insufficient documentation

## 2016-02-10 DIAGNOSIS — F4323 Adjustment disorder with mixed anxiety and depressed mood: Secondary | ICD-10-CM | POA: Diagnosis not present

## 2016-02-10 DIAGNOSIS — M47812 Spondylosis without myelopathy or radiculopathy, cervical region: Secondary | ICD-10-CM

## 2016-02-10 NOTE — Progress Notes (Signed)
Adam Norton is a 40 y.o. male who presents to [facility]: Primary Care Sports Medicine today for follow-up left shoulder pain radiating to the left hand.  Patient has had ongoing pain in his left shoulder with numbness and tingling into his left hand. This is thought to be cervical radiculopathy. He had an MRI 2 days ago that showed right-sided facet arthritis and no foraminal stenosis on the right side. He has no significant left-sided problems.  Shoulder pain is located in the anterior lateral shoulder and worse with overhead motion. His hand numbness and tingling and pain is confined to the radial 3 fingers on the palm side. Symptoms are worse with activity better with rest. No recent injury.  Additionally he does not right-sided neck pain that does not radiate. This is been ongoing for a few weeks now. Symptoms are worse with neck motion and better with rest.   No past medical history on file. Past Surgical History:  Procedure Laterality Date  . KIDNEY STONE SURGERY     Social History  Substance Use Topics  . Smoking status: Current Some Day Smoker  . Smokeless tobacco: Not on file  . Alcohol use 0.0 oz/week   family history includes Alcohol abuse in his brother and father; Diabetes in his father.  ROS as above:  Medications: Current Outpatient Prescriptions  Medication Sig Dispense Refill  . sertraline (ZOLOFT) 100 MG tablet Take 1 tablet (100 mg total) by mouth daily. 90 tablet 2  . traZODone (DESYREL) 100 MG tablet Take 1 tablet (100 mg total) by mouth at bedtime as needed. for sleep 90 tablet 2   No current facility-administered medications for this visit.    No Known Allergies   Exam:  BP 123/74   Pulse 93   Wt 168 lb (76.2 kg)  Gen: Well NAD HEENT: EOMI,  MMM Lungs: Normal work of breathing. CTABL Heart: RRR no MRG Abd: NABS, Soft. Nondistended, Nontender Exts: Brisk  capillary refill, warm and well perfused.  Left shoulder. Nontender normal appearing. Abduction range of motion limited to 120 by pain active full passive motion. Normal external and internal rotation. Next a positive Hawkins and Neer's test. Negative Yergason'stest.  Left hand: Normal grip strength in appearance. Normal sensation. Positive Tinel's of her carpal tunnel.  Neck: Nontender to midline normal neck motion. Pain with right lateral flexion and rotation. Tender right cervical paraspinal muscle group.  Procedure: Real-time Ultrasound Guided Injection of left subacromial bursa  Device: GE Logiq E  Images permanently stored and available for review in the ultrasound unit. Verbal informed consent obtained. Discussed risks and benefits of procedure. Warned about infection bleeding damage to structures skin hypopigmentation and fat atrophy among others. Patient expresses understanding and agreement Time-out conducted.  Noted no overlying erythema, induration, or other signs of local infection.  Skin prepped in a sterile fashion.  Local anesthesia: Topical Ethyl chloride.  With sterile technique and under real time ultrasound guidance: 40 mg of Kenalog and 3 mL of Marcaine injected easily.  Marcaine lot number [id number] Kenalog lot #[id number] Completed without difficulty  Pain partially resolved suggesting accurate placement of the medication.  Advised to call if fevers/chills, erythema, induration, drainage, or persistent bleeding.  Images permanently stored and available for review in the ultrasound unit.  Impression: Technically successful ultrasound guided injection.     Dg Eye Foreign Body  Result Date: 02/08/2016 CLINICAL DATA:  Metal working/exposure; clearance prior to MRI EXAM: ORBITS FOR FOREIGN  BODY - 2 VIEW COMPARISON:  None. FINDINGS: There is no evidence of metallic foreign body within the orbits. No significant bone abnormality identified. IMPRESSION: No  evidence of metallic foreign body within the orbits. Electronically Signed   By: Jeronimo Greaves M.D.   On: 02/08/2016 14:02  Mr Cervical Spine Wo Contrast  Result Date: 02/08/2016 CLINICAL DATA:  Neck pain over the last 4 weeks radiating to the left shoulder in into the fingers. Numbness and tingling. EXAM: MRI CERVICAL SPINE WITHOUT CONTRAST TECHNIQUE: Multiplanar, multisequence MR imaging of the cervical spine was performed. No intravenous contrast was administered. COMPARISON:  None. FINDINGS: Alignment: Normal Vertebrae: No primary bone lesion. Cord: Normal Posterior Fossa, vertebral arteries, paraspinal tissues: Normal Disc levels: Foramen magnum and C1-2 are normal. C2-3:  Normal C3-4:  No disc pathology.  Mild facet hypertrophy on the left. C4-5: Advanced right-sided facet arthropathy. No disc abnormality. Central canal is widely patent. Foraminal stenosis on the right could affect the C5 nerve root C5-6:  Normal C6-7:  Normal C7-T1: Normal. IMPRESSION: No distinct cause of left-sided pain is identified. No disc herniation or stenosis of the canal or foramina. Mild facet hypertrophy on the left at C3-4 without edema. Patient does have pronounced facet arthropathy on the right at C4-5 which could be painful. Foraminal stenosis on the right because of bony encroachment could affect the right C5 nerve root. Electronically Signed   By: Paulina Fusi M.D.   On: 02/08/2016 15:05   No results found for this or any previous visit (from the past 24 hour(s)). No results found.    Assessment and Plan: 40 y.o. male with   Left shoulder pain: Doubtful for cervical radicular component. Suspect rotator cuff tendinitis. Ultrasound guided injection today with moderate improvement in symptoms. Plan for physical therapy and recheck in a few weeks.  Additionally patient's left hand symptoms are not cervical radiculopathy however I suspect more likely carpal tunnel syndrome. Plan for night splint and recheck in a  few weeks.  The patient has neck pain I suspect is due to the right facet arthritis as seen on MRI. Plan for physical therapy if not better will refer for facet injection.  Discussed warning signs or symptoms. Please see discharge instructions. Patient expresses understanding.

## 2016-02-10 NOTE — Patient Instructions (Signed)
Thank you for coming in today. Attend PT.  Return in 2-4 weeks.  Use the wrist brace.  Return sooner if needed.  Call or go to the ER if you develop a large red swollen joint with extreme pain or oozing puss.    Impingement Syndrome, Rotator Cuff, Bursitis With Rehab Impingement syndrome is a condition that involves inflammation of the tendons of the rotator cuff and the subacromial bursa, that causes pain in the shoulder. The rotator cuff consists of four tendons and muscles that control much of the shoulder and upper arm function. The subacromial bursa is a fluid filled sac that helps reduce friction between the rotator cuff and one of the bones of the shoulder (acromion). Impingement syndrome is usually an overuse injury that causes swelling of the bursa (bursitis), swelling of the tendon (tendonitis), and/or a tear of the tendon (strain). Strains are classified into three categories. Grade 1 strains cause pain, but the tendon is not lengthened. Grade 2 strains include a lengthened ligament, due to the ligament being stretched or partially ruptured. With grade 2 strains there is still function, although the function may be decreased. Grade 3 strains include a complete tear of the tendon or muscle, and function is usually impaired. SYMPTOMS   Pain around the shoulder, often at the outer portion of the upper arm.  Pain that gets worse with shoulder function, especially when reaching overhead or lifting.  Sometimes, aching when not using the arm.  Pain that wakes you up at night.  Sometimes, tenderness, swelling, warmth, or redness over the affected area.  Loss of strength.  Limited motion of the shoulder, especially reaching behind the back (to the back pocket or to unhook bra) or across your body.  Crackling sound (crepitation) when moving the arm.  Biceps tendon pain and inflammation (in the front of the shoulder). Worse when bending the elbow or lifting. CAUSES  Impingement syndrome  is often an overuse injury, in which chronic (repetitive) motions cause the tendons or bursa to become inflamed. A strain occurs when a force is paced on the tendon or muscle that is greater than it can withstand. Common mechanisms of injury include: Stress from sudden increase in duration, frequency, or intensity of training.  Direct hit (trauma) to the shoulder.  Aging, erosion of the tendon with normal use.  Bony bump on shoulder (acromial spur). RISK INCREASES WITH:  Contact sports (football, wrestling, boxing).  Throwing sports (baseball, tennis, volleyball).  Weightlifting and bodybuilding.  Heavy labor.  Previous injury to the rotator cuff, including impingement.  Poor shoulder strength and flexibility.  Failure to warm up properly before activity.  Inadequate protective equipment.  Old age.  Bony bump on shoulder (acromial spur). PREVENTION   Warm up and stretch properly before activity.  Allow for adequate recovery between workouts.  Maintain physical fitness:  Strength, flexibility, and endurance.  Cardiovascular fitness.  Learn and use proper exercise technique. PROGNOSIS  If treated properly, impingement syndrome usually goes away within 6 weeks. Sometimes surgery is required.  RELATED COMPLICATIONS   Longer healing time if not properly treated, or if not given enough time to heal.  Recurring symptoms, that result in a chronic condition.  Shoulder stiffness, frozen shoulder, or loss of motion.  Rotator cuff tendon tear.  Recurring symptoms, especially if activity is resumed too soon, with overuse, with a direct blow, or when using poor technique. TREATMENT  Treatment first involves the use of ice and medicine, to reduce pain and inflammation. The use  of strengthening and stretching exercises may help reduce pain with activity. These exercises may be performed at home or with a therapist. If non-surgical treatment is unsuccessful after more than 6  months, surgery may be advised. After surgery and rehabilitation, activity is usually possible in 3 months.  MEDICATION  If pain medicine is needed, nonsteroidal anti-inflammatory medicines (aspirin and ibuprofen), or other minor pain relievers (acetaminophen), are often advised.  Do not take pain medicine for 7 days before surgery.  Prescription pain relievers may be given, if your caregiver thinks they are needed. Use only as directed and only as much as you need.  Corticosteroid injections may be given by your caregiver. These injections should be reserved for the most serious cases, because they may only be given a certain number of times. HEAT AND COLD  Cold treatment (icing) should be applied for 10 to 15 minutes every 2 to 3 hours for inflammation and pain, and immediately after activity that aggravates your symptoms. Use ice packs or an ice massage.  Heat treatment may be used before performing stretching and strengthening activities prescribed by your caregiver, physical therapist, or athletic trainer. Use a heat pack or a warm water soak. SEEK MEDICAL CARE IF:   Symptoms get worse or do not improve in 4 to 6 weeks, despite treatment.  New, unexplained symptoms develop. (Drugs used in treatment may produce side effects.) EXERCISES  RANGE OF MOTION (ROM) AND STRETCHING EXERCISES - Impingement Syndrome (Rotator Cuff  Tendinitis, Bursitis) These exercises may help you when beginning to rehabilitate your injury. Your symptoms may go away with or without further involvement from your physician, physical therapist or athletic trainer. While completing these exercises, remember:   Restoring tissue flexibility helps normal motion to return to the joints. This allows healthier, less painful movement and activity.  An effective stretch should be held for at least 30 seconds.  A stretch should never be painful. You should only feel a gentle lengthening or release in the stretched  tissue. STRETCH - Flexion, Standing  Stand with good posture. With an underhand grip on your right / left hand, and an overhand grip on the opposite hand, grasp a broomstick or cane so that your hands are a little more than shoulder width apart.  Keeping your right / left elbow straight and shoulder muscles relaxed, push the stick with your opposite hand, to raise your right / left arm in front of your body and then overhead. Raise your arm until you feel a stretch in your right / left shoulder, but before you have increased shoulder pain.  Try to avoid shrugging your right / left shoulder as your arm rises, by keeping your shoulder blade tucked down and toward your mid-back spine. Hold for __________ seconds.  Slowly return to the starting position. Repeat __________ times. Complete this exercise __________ times per day. STRETCH - Abduction, Supine  Lie on your back. With an underhand grip on your right / left hand and an overhand grip on the opposite hand, grasp a broomstick or cane so that your hands are a little more than shoulder width apart.  Keeping your right / left elbow straight and your shoulder muscles relaxed, push the stick with your opposite hand, to raise your right / left arm out to the side of your body and then overhead. Raise your arm until you feel a stretch in your right / left shoulder, but before you have increased shoulder pain.  Try to avoid shrugging your right /  left shoulder as your arm rises, by keeping your shoulder blade tucked down and toward your mid-back spine. Hold for __________ seconds.  Slowly return to the starting position. Repeat __________ times. Complete this exercise __________ times per day. ROM - Flexion, Active-Assisted  Lie on your back. You may bend your knees for comfort.  Grasp a broomstick or cane so your hands are about shoulder width apart. Your right / left hand should grip the end of the stick, so that your hand is positioned  "thumbs-up," as if you were about to shake hands.  Using your healthy arm to lead, raise your right / left arm overhead, until you feel a gentle stretch in your shoulder. Hold for __________ seconds.  Use the stick to assist in returning your right / left arm to its starting position. Repeat __________ times. Complete this exercise __________ times per day.  ROM - Internal Rotation, Supine   Lie on your back on a firm surface. Place your right / left elbow about 60 degrees away from your side. Elevate your elbow with a folded towel, so that the elbow and shoulder are the same height.  Using a broomstick or cane and your strong arm, pull your right / left hand toward your body until you feel a gentle stretch, but no increase in your shoulder pain. Keep your shoulder and elbow in place throughout the exercise.  Hold for __________ seconds. Slowly return to the starting position. Repeat __________ times. Complete this exercise __________ times per day. STRETCH - Internal Rotation  Place your right / left hand behind your back, palm up.  Throw a towel or belt over your opposite shoulder. Grasp the towel with your right / left hand.  While keeping an upright posture, gently pull up on the towel, until you feel a stretch in the front of your right / left shoulder.  Avoid shrugging your right / left shoulder as your arm rises, by keeping your shoulder blade tucked down and toward your mid-back spine.  Hold for __________ seconds. Release the stretch, by lowering your healthy hand. Repeat __________ times. Complete this exercise __________ times per day. ROM - Internal Rotation   Using an underhand grip, grasp a stick behind your back with both hands.  While standing upright with good posture, slide the stick up your back until you feel a mild stretch in the front of your shoulder.  Hold for __________ seconds. Slowly return to your starting position. Repeat __________ times. Complete this  exercise __________ times per day.  STRETCH - Posterior Shoulder Capsule   Stand or sit with good posture. Grasp your right / left elbow and draw it across your chest, keeping it at the same height as your shoulder.  Pull your elbow, so your upper arm comes in closer to your chest. Pull until you feel a gentle stretch in the back of your shoulder.  Hold for __________ seconds. Repeat __________ times. Complete this exercise __________ times per day. STRENGTHENING EXERCISES - Impingement Syndrome (Rotator Cuff Tendinitis, Bursitis) These exercises may help you when beginning to rehabilitate your injury. They may resolve your symptoms with or without further involvement from your physician, physical therapist or athletic trainer. While completing these exercises, remember:  Muscles can gain both the endurance and the strength needed for everyday activities through controlled exercises.  Complete these exercises as instructed by your physician, physical therapist or athletic trainer. Increase the resistance and repetitions only as guided.  You may experience muscle soreness or  fatigue, but the pain or discomfort you are trying to eliminate should never worsen during these exercises. If this pain does get worse, stop and make sure you are following the directions exactly. If the pain is still present after adjustments, discontinue the exercise until you can discuss the trouble with your clinician.  During your recovery, avoid activity or exercises which involve actions that place your injured hand or elbow above your head or behind your back or head. These positions stress the tissues which you are trying to heal. STRENGTH - Scapular Depression and Adduction   With good posture, sit on a firm chair. Support your arms in front of you, with pillows, arm rests, or on a table top. Have your elbows in line with the sides of your body.  Gently draw your shoulder blades down and toward your mid-back  spine. Gradually increase the tension, without tensing the muscles along the top of your shoulders and the back of your neck.  Hold for __________ seconds. Slowly release the tension and relax your muscles completely before starting the next repetition.  After you have practiced this exercise, remove the arm support and complete the exercise in standing as well as sitting position. Repeat __________ times. Complete this exercise __________ times per day.  STRENGTH - Shoulder Abductors, Isometric  With good posture, stand or sit about 4-6 inches from a wall, with your right / left side facing the wall.  Bend your right / left elbow. Gently press your right / left elbow into the wall. Increase the pressure gradually, until you are pressing as hard as you can, without shrugging your shoulder or increasing any shoulder discomfort.  Hold for __________ seconds.  Release the tension slowly. Relax your shoulder muscles completely before you begin the next repetition. Repeat __________ times. Complete this exercise __________ times per day.  STRENGTH - External Rotators, Isometric  Keep your right / left elbow at your side and bend it 90 degrees.  Step into a door frame so that the outside of your right / left wrist can press against the door frame without your upper arm leaving your side.  Gently press your right / left wrist into the door frame, as if you were trying to swing the back of your hand away from your stomach. Gradually increase the tension, until you are pressing as hard as you can, without shrugging your shoulder or increasing any shoulder discomfort.  Hold for __________ seconds.  Release the tension slowly. Relax your shoulder muscles completely before you begin the next repetition. Repeat __________ times. Complete this exercise __________ times per day.  STRENGTH - Supraspinatus   Stand or sit with good posture. Grasp a __________ weight, or an exercise band or tubing, so  that your hand is "thumbs-up," like you are shaking hands.  Slowly lift your right / left arm in a "V" away from your thigh, diagonally into the space between your side and straight ahead. Lift your hand to shoulder height or as far as you can, without increasing any shoulder pain. At first, many people do not lift their hands above shoulder height.  Avoid shrugging your right / left shoulder as your arm rises, by keeping your shoulder blade tucked down and toward your mid-back spine.  Hold for __________ seconds. Control the descent of your hand, as you slowly return to your starting position. Repeat __________ times. Complete this exercise __________ times per day.  STRENGTH - External Rotators  Secure a rubber exercise band or  tubing to a fixed object (table, pole) so that it is at the same height as your right / left elbow when you are standing or sitting on a firm surface.  Stand or sit so that the secured exercise band is at your uninjured side.  Bend your right / left elbow 90 degrees. Place a folded towel or small pillow under your right / left arm, so that your elbow is a few inches away from your side.  Keeping the tension on the exercise band, pull it away from your body, as if pivoting on your elbow. Be sure to keep your body steady, so that the movement is coming only from your rotating shoulder.  Hold for __________ seconds. Release the tension in a controlled manner, as you return to the starting position. Repeat __________ times. Complete this exercise __________ times per day.  STRENGTH - Internal Rotators   Secure a rubber exercise band or tubing to a fixed object (table, pole) so that it is at the same height as your right / left elbow when you are standing or sitting on a firm surface.  Stand or sit so that the secured exercise band is at your right / left side.  Bend your elbow 90 degrees. Place a folded towel or small pillow under your right / left arm so that your  elbow is a few inches away from your side.  Keeping the tension on the exercise band, pull it across your body, toward your stomach. Be sure to keep your body steady, so that the movement is coming only from your rotating shoulder.  Hold for __________ seconds. Release the tension in a controlled manner, as you return to the starting position. Repeat __________ times. Complete this exercise __________ times per day.  STRENGTH - Scapular Protractors, Standing   Stand arms length away from a wall. Place your hands on the wall, keeping your elbows straight.  Begin by dropping your shoulder blades down and toward your mid-back spine.  To strengthen your protractors, keep your shoulder blades down, but slide them forward on your rib cage. It will feel as if you are lifting the back of your rib cage away from the wall. This is a subtle motion and can be challenging to complete. Ask your caregiver for further instruction, if you are not sure you are doing the exercise correctly.  Hold for __________ seconds. Slowly return to the starting position, resting the muscles completely before starting the next repetition. Repeat __________ times. Complete this exercise __________ times per day. STRENGTH - Scapular Protractors, Supine  Lie on your back on a firm surface. Extend your right / left arm straight into the air while holding a __________ weight in your hand.  Keeping your head and back in place, lift your shoulder off the floor.  Hold for __________ seconds. Slowly return to the starting position, and allow your muscles to relax completely before starting the next repetition. Repeat __________ times. Complete this exercise __________ times per day. STRENGTH - Scapular Protractors, Quadruped  Get onto your hands and knees, with your shoulders directly over your hands (or as close as you can be, comfortably).  Keeping your elbows locked, lift the back of your rib cage up into your shoulder  blades, so your mid-back rounds out. Keep your neck muscles relaxed.  Hold this position for __________ seconds. Slowly return to the starting position and allow your muscles to relax completely before starting the next repetition. Repeat __________ times. Complete this exercise  __________ times per day.  STRENGTH - Scapular Retractors  Secure a rubber exercise band or tubing to a fixed object (table, pole), so that it is at the height of your shoulders when you are either standing, or sitting on a firm armless chair.  With a palm down grip, grasp an end of the band in each hand. Straighten your elbows and lift your hands straight in front of you, at shoulder height. Step back, away from the secured end of the band, until it becomes tense.  Squeezing your shoulder blades together, draw your elbows back toward your sides, as you bend them. Keep your upper arms lifted away from your body throughout the exercise.  Hold for __________ seconds. Slowly ease the tension on the band, as you reverse the directions and return to the starting position. Repeat __________ times. Complete this exercise __________ times per day. STRENGTH - Shoulder Extensors   Secure a rubber exercise band or tubing to a fixed object (table, pole) so that it is at the height of your shoulders when you are either standing, or sitting on a firm armless chair.  With a thumbs-up grip, grasp an end of the band in each hand. Straighten your elbows and lift your hands straight in front of you, at shoulder height. Step back, away from the secured end of the band, until it becomes tense.  Squeezing your shoulder blades together, pull your hands down to the sides of your thighs. Do not allow your hands to go behind you.  Hold for __________ seconds. Slowly ease the tension on the band, as you reverse the directions and return to the starting position. Repeat __________ times. Complete this exercise __________ times per day.  STRENGTH  - Scapular Retractors and External Rotators   Secure a rubber exercise band or tubing to a fixed object (table, pole) so that it is at the height as your shoulders, when you are either standing, or sitting on a firm armless chair.  With a palm down grip, grasp an end of the band in each hand. Bend your elbows 90 degrees and lift your elbows to shoulder height, at your sides. Step back, away from the secured end of the band, until it becomes tense.  Squeezing your shoulder blades together, rotate your shoulders so that your upper arms and elbows remain stationary, but your fists travel upward to head height.  Hold for __________ seconds. Slowly ease the tension on the band, as you reverse the directions and return to the starting position. Repeat __________ times. Complete this exercise __________ times per day.  STRENGTH - Scapular Retractors and External Rotators, Rowing   Secure a rubber exercise band or tubing to a fixed object (table, pole) so that it is at the height of your shoulders, when you are either standing, or sitting on a firm armless chair.  With a palm down grip, grasp an end of the band in each hand. Straighten your elbows and lift your hands straight in front of you, at shoulder height. Step back, away from the secured end of the band, until it becomes tense.  Step 1: Squeeze your shoulder blades together. Bending your elbows, draw your hands to your chest, as if you are rowing a boat. At the end of this motion, your hands and elbow should be at shoulder height and your elbows should be out to your sides.  Step 2: Rotate your shoulders, to raise your hands above your head. Your forearms should be vertical and your  upper arms should be horizontal.  Hold for __________ seconds. Slowly ease the tension on the band, as you reverse the directions and return to the starting position. Repeat __________ times. Complete this exercise __________ times per day.  STRENGTH - Scapular  Depressors  Find a sturdy chair without wheels, such as a dining room chair.  Keeping your feet on the floor, and your hands on the chair arms, lift your bottom up from the seat, and lock your elbows.  Keeping your elbows straight, allow gravity to pull your body weight down. Your shoulders will rise toward your ears.  Raise your body against gravity by drawing your shoulder blades down your back, shortening the distance between your shoulders and ears. Although your feet should always maintain contact with the floor, your feet should progressively support less body weight, as you get stronger.  Hold for __________ seconds. In a controlled and slow manner, lower your body weight to begin the next repetition. Repeat __________ times. Complete this exercise __________ times per day.    This information is not intended to replace advice given to you by your health care provider. Make sure you discuss any questions you have with your health care provider.   Document Released: 07/05/2005 Document Revised: 07/26/2014 Document Reviewed: 10/17/2008 [employer] Interactive Patient Education 2016 [employer]    Carpal Tunnel Syndrome Carpal tunnel syndrome is a condition that causes pain in your hand and arm. The carpal tunnel is a narrow area located on the palm side of your wrist. Repeated wrist motion or certain diseases may cause swelling within the tunnel. This swelling pinches the main nerve in the wrist (median nerve). CAUSES  This condition may be caused by:   Repeated wrist motions.  Wrist injuries.  Arthritis.  A cyst or tumor in the carpal tunnel.  Fluid buildup during pregnancy. Sometimes the cause of this condition is not known.  RISK FACTORS This condition is more likely to develop in:   People who have jobs that cause them to repeatedly move their wrists in the same motion, such as butchers and cashiers.  Women.  People with certain conditions, such  as:  Diabetes.  Obesity.  An underactive thyroid (hypothyroidism).  Kidney failure. SYMPTOMS  Symptoms of this condition include:   A tingling feeling in your fingers, especially in your thumb, index, and middle fingers.  Tingling or numbness in your hand.  An aching feeling in your entire arm, especially when your wrist and elbow are bent for long periods of time.  Wrist pain that goes up your arm to your shoulder.  Pain that goes down into your palm or fingers.  A weak feeling in your hands. You may have trouble grabbing and holding items. Your symptoms may feel worse during the night.  DIAGNOSIS  This condition is diagnosed with a medical history and physical exam. You may also have tests, including:   An electromyogram (EMG). This test measures electrical signals sent by your nerves into the muscles.  X-rays. TREATMENT  Treatment for this condition includes:  Lifestyle changes. It is important to stop doing or modify the activity that caused your condition.  Physical or occupational therapy.  Medicines for pain and inflammation. This may include medicine that is injected into your wrist.  A wrist splint.  Surgery. HOME CARE INSTRUCTIONS  If You Have a Splint:  Wear it as told by your health care provider. Remove it only as told by your health care provider.  Loosen the splint if  your fingers become numb and tingle, or if they turn cold and blue.  Keep the splint clean and dry. General Instructions  Take over-the-counter and prescription medicines only as told by your health care provider.  Rest your wrist from any activity that may be causing your pain. If your condition is work related, talk to your employer about changes that can be made, such as getting a wrist pad to use while typing.  If directed, apply ice to the painful area:  Put ice in a plastic bag.  Place a towel between your skin and the bag.  Leave the ice on for 20 minutes, 2-3 times per  day.  Keep all follow-up visits as told by your health care provider. This is important.  Do any exercises as told by your health care provider, physical therapist, or occupational therapist. SEEK MEDICAL CARE IF:   You have new symptoms.  Your pain is not controlled with medicines.  Your symptoms get worse.   This information is not intended to replace advice given to you by your health care provider. Make sure you discuss any questions you have with your health care provider.   Document Released: 07/02/2000 Document Revised: 03/26/2015 Document Reviewed: 11/20/2014 Elsevier Interactive Patient Education [employer]

## 2016-02-14 ENCOUNTER — Telehealth: Payer: Self-pay | Admitting: Family Medicine

## 2016-02-14 DIAGNOSIS — M25512 Pain in left shoulder: Secondary | ICD-10-CM

## 2016-02-14 DIAGNOSIS — G5602 Carpal tunnel syndrome, left upper limb: Secondary | ICD-10-CM

## 2016-02-14 DIAGNOSIS — M47812 Spondylosis without myelopathy or radiculopathy, cervical region: Secondary | ICD-10-CM

## 2016-02-14 NOTE — Telephone Encounter (Signed)
Discussed symptoms with patient.  He continues to have neck and left shoulder pain.   1) Neck Pain likely due to Rt c4/5 facet. Plan for Facet injection at [facility].  2) Lt shoulder pain not better after injection and PT.  Plan for MRI.

## 2016-02-16 NOTE — Telephone Encounter (Signed)
Imaging does not require Authorization, imaging notified to schedule Pt.

## 2016-02-17 ENCOUNTER — Encounter: Payer: Self-pay | Admitting: Rehabilitative and Restorative Service Providers"

## 2016-02-17 ENCOUNTER — Ambulatory Visit (INDEPENDENT_AMBULATORY_CARE_PROVIDER_SITE_OTHER): Payer: BLUE CROSS/BLUE SHIELD | Admitting: Rehabilitative and Restorative Service Providers"

## 2016-02-17 DIAGNOSIS — M5412 Radiculopathy, cervical region: Secondary | ICD-10-CM

## 2016-02-17 DIAGNOSIS — M791 Myalgia, unspecified site: Secondary | ICD-10-CM

## 2016-02-17 DIAGNOSIS — R29898 Other symptoms and signs involving the musculoskeletal system: Secondary | ICD-10-CM

## 2016-02-17 DIAGNOSIS — M25512 Pain in left shoulder: Secondary | ICD-10-CM

## 2016-02-17 NOTE — Therapy (Addendum)
[facility] [place], [place], [address] Phone: [phone]   Fax:  [phone]  Physical Therapy Treatment  Patient Details  Name: Adam Norton MRN: [id number] Date of Birth: 1976-05-24 Referring Provider: Dr. Georgina Snell  Encounter Date: 02/17/2016      PT End of Session - 02/17/16 0812    Visit Number 3   Number of Visits 12   Date for PT Re-Evaluation 03/30/16   PT Start Time 0810   PT Stop Time 0907   PT Time Calculation (min) 57 min   Activity Tolerance Patient tolerated treatment well      History reviewed. No pertinent past medical history.  Past Surgical History:  Procedure Laterality Date  . KIDNEY STONE SURGERY      There were no vitals filed for this visit.      Subjective Assessment - 02/17/16 0813    Subjective Adam Norton reports symptoms have continued since last PT visit and now having pain in the Rt shoulder and posterior Lt arm;; neck as well as symptoms of CTS Lt. He will be scheduled for NCV and MRI to further diagnose current problems.    Pertinent History Denies any previous shoudler or arm problems. He was injured in [place] in 1997 with HNP in [place] and he will have occasonal problems with the back and sciatic nerve. Kidney stone 3-5 years ago.    How long can you sit comfortably? not at all   How long can you stand comfortably? not at all   How long can you walk comfortably? not at all   Diagnostic tests xray of shoulder    Patient Stated Goals get rid of pain and tingling    Currently in Pain? Yes   Pain Score 3    Pain Location Shoulder   Pain Orientation Left   Pain Descriptors / Indicators Aching   Pain Type Acute pain   Pain Radiating Towards into the Lt arm shouder elbow area    Pain Onset More than a month ago   Pain Frequency Constant   Aggravating Factors  using arm; driving; lifting    Pain Relieving Factors TENS unit   Multiple Pain Sites Yes   Pain Score 4   Pain Location  Shoulder   Pain Orientation Right   Pain Descriptors / Indicators Aching;Sharp   Pain Type Acute pain   Pain Radiating Towards shoulder forearm to hand    Pain Onset More than a month ago   Pain Frequency Constant   Aggravating Factors  any movement    Pain Relieving Factors meds ease pain for short time    Pain Score 3   Pain Location Neck   Pain Orientation Lower;Mid;Right;Left   Pain Descriptors / Indicators Aching;Burning   Pain Type Acute pain   Pain Radiating Towards headaches intermittently - into shoulders at times    Pain Onset More than a month ago   Pain Frequency Constant   Aggravating Factors  looking up ; looking Lt/Rt; lying down; sitting   Pain Relieving Factors nothing - heating pad helps to relax still has pain            [facility] PT Assessment - 02/17/16 0001      Assessment   Medical Diagnosis Lt shoudler pain    Referring Provider Dr. Georgina Snell   Onset Date/Surgical Date 01/05/16   Hand Dominance Right   Next MD Visit 8/17   Prior Therapy none      Balance Screen  Has the patient fallen in the past 6 months No   Has the patient had a decrease in activity level because of a fear of falling?  No   Is the patient reluctant to leave their home because of a fear of falling?  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 lifting 10 to 50 # varied through the day; floor to chest/head level - some done with equipment. Standing on concrete all day    Leisure fishing; houshold chores     Observation/Other Assessments   Focus on Therapeutic Outcomes (FOTO)  50% limitation      Sensation   Additional Comments glove like tingling through the Lt UE into the hand on a constant basis      Posture/Postural Control   Posture Comments head forward; shoulders rounded and  elevated; head of the humerus anterior in orientation; scapulae abducted and rotated along the thoracic spine     AROM   Right Shoulder Extension 55 Degrees   Right Shoulder Flexion 144 Degrees   Right Shoulder ABduction 107 Degrees   Right Shoulder Internal Rotation 30 Degrees   Right Shoulder External Rotation 84 Degrees   Left Shoulder Extension 30 Degrees   Left Shoulder Flexion 124 Degrees   Left Shoulder ABduction 88 Degrees   Left Shoulder Internal Rotation 28 Degrees   Left Shoulder External Rotation 75 Degrees   Cervical Flexion 57   Cervical Extension 37   Cervical - Right Side Bend 36   Cervical - Left Side Bend 25   Cervical - Right Rotation 56   Cervical - Left Rotation 35     Strength   Overall Strength Comments 5/5 bilat UE's pain with resitted Lt shoudler ER/IR      Palpation   Spinal mobility tightness through the thoracic and cervical spine with CPA and UPA mobs grade II/III    Palpation comment tightness through ant/lat/post cervical musculature; upper trap; leveator; pecs; teres; periscapular musculature Lt > Rt                      [facility] Adult PT Treatment/Exercise - 02/17/16 0001      Neck Exercises: Standing   Neck Retraction 5 secs  3 reps, with tactile cues     Shoulder Exercises: Standing   Other Standing Exercises scap squeeze 10 sec x 10 with noodle      Shoulder Exercises: Stretch   Other Shoulder Stretches 2 way doorway stretch 30 sec x 3      Moist Heat Therapy   Number Minutes Moist Heat 20 Minutes   Moist Heat Location Cervical;Shoulder     Electrical Stimulation   Electrical Stimulation Location bilat cervical; Lt shd   Electrical Stimulation Action IFC   Electrical Stimulation Parameters to tolerance   Electrical Stimulation Goals Tone;Pain                PT Education - 02/17/16 0846    Education provided Yes   Education Details reviewed HEP; encouraged consistent HEP and work on posture and alignment; has  TENS unit    Person(s) Educated Patient   Methods Explanation;Demonstration;Tactile cues;Verbal cues;Handout   Comprehension Verbalized understanding;Returned demonstration;Verbal cues required;Tactile cues required  PT Long Term Goals - 02/17/16 1110      PT LONG TERM GOAL #1   Title Improve posture and alignment with patient to demonstrate upright posture 03/30/16   Time 6   Period Weeks   Status Revised     PT LONG TERM GOAL #2   Title Decrease tightness to palpation through the cervical and Lt upper quarter 03/30/16   Time 6   Period Weeks   Status Revised     PT LONG TERM GOAL #3   Title Decrease/eliminate radicular tingling into the Lt UE 03/30/16   Time 6   Period Weeks   Status Revised     PT LONG TERM GOAL #4   Title Independent in HEP 03/30/16   Time 6   Period Weeks   Status Revised     PT LONG TERM GOAL #5   Title Improve FOTO to </= 29% limitation 03/30/16   Time 6   Period Weeks   Status Revised               Plan - 02/17/16 0846    Clinical Impression Statement Patient returns to PT after attending two visits 6/17 with increased symptoms in Lt UE as well as neck and Rt shoulder pain. He has poor posture and alignment; limited cervical and bilat shoulder ROM; pain with resisted strength testing; muscular tightness to palpation throughout cervical and bilat shoulder girdle areas.    Rehab Potential Good   PT Frequency 2x / week   PT Duration 6 weeks   PT Treatment/Interventions Patient/family education;ADLs/Self Care Home Management;Cryotherapy;Electrical Stimulation;Iontophoresis 12m/ml Dexamethasone;Moist Heat;Traction;Ultrasound;Neuromuscular re-education;Manual techniques;Dry needling;Therapeutic activities;Therapeutic exercise   PT Next Visit Plan Continue postural correction and strengthening. Manual therapy and TDN as indicated.    Consulted and Agree with Plan of Care Patient      Patient will benefit from skilled therapeutic  intervention in order to improve the following deficits and impairments:  Postural dysfunction, Improper body mechanics, Pain, Decreased range of motion, Decreased mobility, Increased fascial restricitons, Increased muscle spasms, Decreased activity tolerance  Visit Diagnosis: Pain in left shoulder - Plan: PT plan of care cert/re-cert  Radiculopathy, cervical region - Plan: PT plan of care cert/re-cert  Myalgia - Plan: PT plan of care cert/re-cert  Other symptoms and signs involving the musculoskeletal system - Plan: PT plan of care cert/re-cert     Problem List Patient Active Problem List   Diagnosis Date Noted  . Left carpal tunnel syndrome 02/10/2016  . Acute bursitis of left shoulder 02/10/2016  . Arthropathy of cervical facet joint ([place] 01/13/2016  . Left shoulder pain 01/05/2016  . Insomnia 08/25/2015  . Generalized anxiety disorder 08/18/2015  . Depression 08/18/2015    Kiaya Haliburton PNilda SimmerPT, MPH  02/17/2016, 11:15 AM  [facility] [address]Phone: [phone]  Fax:  [phone] Name: Adam ReisterMRN: [id number] of Birth: 2Nov 28, 1977 PHYSICAL THERAPY DISCHARGE SUMMARY  Visits from Start of Care: eval only   Current functional level related to goals / functional outcomes: See evaluation for discharge status   Remaining deficits: unchanged   Education / Equipment: HEP Plan: Patient agrees to discharge.  Patient goals were not met. Patient is being discharged due to not returning since the last visit.  ?????     Aela Bohan P. HHelene KelpPT, MPH 06/14/16 8:26 AM

## 2016-02-23 ENCOUNTER — Ambulatory Visit (INDEPENDENT_AMBULATORY_CARE_PROVIDER_SITE_OTHER): Payer: BLUE CROSS/BLUE SHIELD

## 2016-02-23 ENCOUNTER — Encounter: Payer: BLUE CROSS/BLUE SHIELD | Admitting: Physical Therapy

## 2016-02-23 DIAGNOSIS — M25512 Pain in left shoulder: Secondary | ICD-10-CM | POA: Diagnosis not present

## 2016-02-23 DIAGNOSIS — Z0389 Encounter for observation for other suspected diseases and conditions ruled out: Secondary | ICD-10-CM | POA: Diagnosis not present

## 2016-02-24 DIAGNOSIS — F4323 Adjustment disorder with mixed anxiety and depressed mood: Secondary | ICD-10-CM | POA: Diagnosis not present

## 2016-02-25 ENCOUNTER — Encounter: Payer: Self-pay | Admitting: Family Medicine

## 2016-02-25 ENCOUNTER — Ambulatory Visit (INDEPENDENT_AMBULATORY_CARE_PROVIDER_SITE_OTHER): Payer: BLUE CROSS/BLUE SHIELD | Admitting: Family Medicine

## 2016-02-25 VITALS — BP 115/69 | HR 85 | Wt 168.0 lb

## 2016-02-25 DIAGNOSIS — M1288 Other specific arthropathies, not elsewhere classified, other specified site: Secondary | ICD-10-CM | POA: Diagnosis not present

## 2016-02-25 DIAGNOSIS — M5412 Radiculopathy, cervical region: Secondary | ICD-10-CM

## 2016-02-25 DIAGNOSIS — M47812 Spondylosis without myelopathy or radiculopathy, cervical region: Secondary | ICD-10-CM

## 2016-02-25 DIAGNOSIS — M7552 Bursitis of left shoulder: Secondary | ICD-10-CM | POA: Diagnosis not present

## 2016-02-25 NOTE — Progress Notes (Signed)
Adam Norton is a 40 y.o. male [facility] today for follow-up shoulder MRI and discuss new right arm radicular pain.  She was seen recently for left shoulder pain. He had a subacromial bursa injection on July 25. Immediately following the injection he had good pain relief. However a few days afterward he really was not feeling much better at all. At that time after a phone conversation with elected for a shoulder MRI which was done on the 7th of August. The shoulder MRI showed no significant abnormalities. However he notes the shoulder pain has resolved over the few weeks.  However he does have new right arm pain. He notes pain radiating to the right axilla and right forearm. This is worse with neck motion. He additionally notes neck pain. He has had a cervical MRI which showed facet DJD at C4-5 with right C5 nerve root encroachment.  Symptoms are moderate and worse with activity. He's had physical therapy for both his shoulder and his neck which have helped only a little   No past medical history on file. Past Surgical History:  Procedure Laterality Date  . KIDNEY STONE SURGERY     Social History  Substance Use Topics  . Smoking status: Current Some Day Smoker  . Smokeless tobacco: Not on file  . Alcohol use 0.0 oz/week   family history includes Alcohol abuse in his brother and father; Diabetes in his father.  ROS as above:  Medications: Current Outpatient Prescriptions  Medication Sig Dispense Refill  . sertraline (ZOLOFT) 100 MG tablet Take 1 tablet (100 mg total) by mouth daily. 90 tablet 2  . traZODone (DESYREL) 100 MG tablet Take 1 tablet (100 mg total) by mouth at bedtime as needed. for sleep 90 tablet 2   No current facility-administered medications for this visit.    No Known Allergies   Exam:  BP 115/69   Pulse 85   Wt 168 lb (76.2  kg)  Gen: Well NAD HEENT: EOMI,  MMM Lungs: Normal work of breathing. CTABL Heart: RRR no MRG Abd: NABS, Soft. Nondistended, Nontender Exts: Brisk capillary refill, warm and well perfused.  Neck: Nontender normal motion. Positive right-sided Spurling's test. Upper extremity strength is equal and normal throughout bilaterally. Also is capillary refill and sensation are intact distal  Mr Shoulder Left Wo Contrast  Result Date: 02/23/2016 CLINICAL DATA:  1. EXAM: MRI OF THE LEFT SHOULDER WITHOUT CONTRAST TECHNIQUE: Multiplanar, multisequence MR imaging of the shoulder was performed. No intravenous contrast was administered. COMPARISON:  01/05/2016 FINDINGS: Rotator cuff:  Unremarkable Muscles:  Unremarkable Biceps long head:  Unremarkable Acromioclavicular Joint: No significant arthropathy. Subacromial morphology is type 2 (curved). Trace and likely physiologic fluid in the subacromial subdeltoid bursa. Glenohumeral Joint: Unremarkable Labrum: Anterior superior sublabral foramen noted. No discrete labral tear identified. Bones: No significant extra-articular osseous abnormalities identified. Other: No supplemental non-categorized findings. IMPRESSION: 1. No specific internal derangement is identified to further explain the patient's left shoulder soreness and pain. Electronically Signed   By: Adam RongWalter  Liebkemann M.D.   On: 02/23/2016 11:40     No results found for this or any previous visit (from the past 24 hour(s)). No results found.    Assessment and Plan: 40 y.o. male with  1) left shoulder pain: Resolved with injection I suspect. MRI was unremarkable. Plan for watchful waiting.  2) new right cervical radiculopathy likely of the right C5 nerve root. Failing physical  therapy. Discussed options. Plan first cervical epidural steroid injection. Follow up after injection.   Orders Placed This Encounter  Procedures  . DG Epidurography    Order Specific Question:   Reason for Exam (SYMPTOM  OR  DIAGNOSIS REQUIRED)    Answer:   Rt C5 nerve root affected. OK to inject lower into the fatpad if needed    Order Specific Question:   Preferred imaging location?    Answer:   [id number]    Discussed warning signs or symptoms. Please see discharge instructions. Patient expresses understanding.

## 2016-04-08 DIAGNOSIS — G8929 Other chronic pain: Secondary | ICD-10-CM | POA: Diagnosis not present

## 2016-04-08 DIAGNOSIS — M47812 Spondylosis without myelopathy or radiculopathy, cervical region: Secondary | ICD-10-CM | POA: Diagnosis not present

## 2016-04-14 ENCOUNTER — Ambulatory Visit (INDEPENDENT_AMBULATORY_CARE_PROVIDER_SITE_OTHER): Payer: BLUE CROSS/BLUE SHIELD | Admitting: Family Medicine

## 2016-04-14 ENCOUNTER — Encounter: Payer: Self-pay | Admitting: Family Medicine

## 2016-04-14 VITALS — BP 113/71 | HR 90 | Wt 171.0 lb

## 2016-04-14 DIAGNOSIS — M5412 Radiculopathy, cervical region: Secondary | ICD-10-CM | POA: Diagnosis not present

## 2016-04-14 DIAGNOSIS — Z23 Encounter for immunization: Secondary | ICD-10-CM | POA: Diagnosis not present

## 2016-04-14 DIAGNOSIS — M1288 Other specific arthropathies, not elsewhere classified, other specified site: Secondary | ICD-10-CM

## 2016-04-14 DIAGNOSIS — G5602 Carpal tunnel syndrome, left upper limb: Secondary | ICD-10-CM

## 2016-04-14 DIAGNOSIS — M47812 Spondylosis without myelopathy or radiculopathy, cervical region: Secondary | ICD-10-CM

## 2016-04-14 MED ORDER — GABAPENTIN 300 MG PO CAPS: 300.0000 mg | ORAL_CAPSULE | Freq: Every day | ORAL | 3 refills | Status: DC

## 2016-04-14 NOTE — Progress Notes (Signed)
Adam Norton is a 40 y.o. male who [name] today for left shoulder pain. Patient notes continued left neck and shoulder pain. He has pain radiating down to his forearm worse with neck motion. The pain is not worse with arm motion. He has a history of cervical MRI showing facet arthritis at C5 with possible right-sided neural impingement. Additionally he notes some numbness and tingling into his hands bilaterally especially along the first 3 digits. He's interested in a nerve conduction study. He notes nighttime as is the worst time for pain. His pain does interfere with sleep and is moderate. Yesterday his pain was quite bad but today's improved. He recently had an epidural steroid injection last week and notes his symptoms are improving compared to yesterday.   No past medical history on file. Past Surgical History:  Procedure Laterality Date  . KIDNEY STONE SURGERY     Social History  Substance Use Topics  . Smoking status: Current Some Day Smoker  . Smokeless tobacco: Not on file  . Alcohol use 0.0 oz/week   family history includes Alcohol abuse in his brother and father; Diabetes in his father.  ROS as above:  Medications: Current Outpatient Prescriptions  Medication Sig Dispense Refill  . sertraline (ZOLOFT) 100 MG tablet Take 1 tablet (100 mg total) by mouth daily. 90 tablet 2  . traZODone (DESYREL) 100 MG tablet Take 1 tablet (100 mg total) by mouth at bedtime as needed. for sleep 90 tablet 2  . gabapentin (NEURONTIN) 300 MG capsule Take 1 capsule (300 mg total) by mouth at bedtime. 90 capsule 3   No current facility-administered medications for this visit.    No Known Allergies   Exam:  BP 113/71   Pulse 90   Wt 171 lb (77.6 kg)  Gen: Well NAD HEENT: EOMI,  MMM Lungs: Normal work of breathing. CTABL Heart: RRR no MRG Abd: NABS,  Soft. Nondistended, Nontender Exts: Brisk capillary refill, warm and well perfused.  C-spine Nontender to spinal midline. Normal neck motion. Mildly positive left-sided Spurling's test.  Study Result   CLINICAL DATA:  Neck pain over the last 4 weeks radiating to the left shoulder in into the fingers. Numbness and tingling. EXAM: MRI CERVICAL SPINE WITHOUT CONTRAST TECHNIQUE: Multiplanar, multisequence MR imaging of the cervical spine was performed. No intravenous contrast was administered. COMPARISON:  None. FINDINGS: Alignment: Normal Vertebrae: No primary bone lesion. Cord: Normal Posterior Fossa, vertebral arteries, paraspinal tissues: Normal Disc levels: Foramen magnum and C1-2 are normal. C2-3:  Normal C3-4:  No disc pathology.  Mild facet hypertrophy on the left. C4-5: Advanced right-sided facet arthropathy. No disc abnormality. Central canal is widely patent. Foraminal stenosis on the right could affect the C5 nerve root C5-6:  Normal C6-7:  Normal C7-T1: Normal. IMPRESSION: No distinct cause of left-sided pain is identified. No disc herniation or stenosis of the canal or foramina. Mild facet hypertrophy on the left at C3-4 without edema. Patient does have pronounced facet arthropathy on the right at C4-5 which could be painful. Foraminal stenosis on the right because of bony encroachment could affect the right C5 nerve root. Electronically Signed   By: Paulina Fusi M.D.   On: 02/08/2016 15:05      No results found for this or any previous visit (from the past 24 hour(s)). No results found.    Assessment and Plan: 40 y.o. male with left-sided arm pain concerning for  cervical radiculopathy. Additionally patient may have carpal tunnel syndrome. Plan for nerve conduction study in the near future to further delineate the exact cause of pain as his symptoms do not match with his MRI results. Additionally we'll try treating with gabapentin at night. Recheck following  nerve conduction study  Influenza vaccine given prior to discharge.    Orders Placed This Encounter  Procedures  . Nerve conduction test    Standing Status:   Future    Standing Expiration Date:   04/14/2017    Order Specific Question:   Where should this test be performed?    Answer:   [facility]    Discussed warning signs or symptoms. Please see discharge instructions. Patient expresses understanding.

## 2016-04-14 NOTE — Patient Instructions (Signed)
Thank you for coming in today. Take gabapentin at night.  You should hear about the nerve test.  Call in 1 week if you have not heard anything,  Return a few weeks after the nerve study.   Electromyoneurogram is a test to check how well your muscles and nerves are working. This procedure includes the combined use of electromyogram (EMG) and nerve conduction study (NCS). EMG is used to look for muscular disorders. NCS, which is also called electroneurogram, measures how well your nerves are controlling your muscles. The procedures are usually performed together to check if your muscles and nerves are healthy. If the reaction to testing is abnormal, this can indicate disease or injury, such as peripheral nerve damage. LET [facility] PROVIDER KNOW ABOUT:  Any allergies you have.  All medicines you are taking, including vitamins, herbs, eye drops, creams, and over-the-counter medicines.  Previous problems you or members of your family have had with the use of anesthetics.  Any blood disorders you have.  Previous surgeries you have had.  Any medical conditions you have.  Any pacemaker you have. RISKS AND COMPLICATIONS Generally, this is a safe procedure. However, problems may occur, including:  Infection where the electrodes were inserted.  Bleeding. BEFORE THE PROCEDURE  Ask your health care provider about:  Changing or stopping your regular medicines. This is especially important if you are taking diabetes medicines or blood thinners.  Taking medicines such as aspirin and ibuprofen. These medicines can thin your blood. Do not take these medicines before your procedure if your health care provider instructs you not to.  Your health care provider may ask you to avoid:  Caffeine, such as coffee and tea.  Nicotine. This includes cigarettes and anything with tobacco.  Do not use lotions or creams on the same day that you will be having the procedure. PROCEDURE For  EMG:  Your health care provider will ask you to stay in a position so that he or she can access the muscle that will be studied. You may be standing, sitting down, or lying down.  You may be given a medicine that numbs the area (local anesthetic).  A very thin needle that has an electrode on it will be inserted into your muscle.  Another small electrode will be placed on your skin near the muscle.  Your health care provider will ask you to continue to remain still.  The electrodes will send a signal that tells about the electrical activity of your muscles. You may see this on a monitor or hear it in the room.  After your muscles have been studied at rest, your health care provider will ask you to contract or flex your muscles. The electrodes will send a signal that tells about the electrical activity of your muscles.  Your health care provider will remove the electrodes and the electrode needles when the procedure is finished. The procedure may vary among health care providers and hospitals. For NCS:  An electrode that records your nerve activity (recording electrode) will be placed on your skin by the muscle that is being studied.  An electrode that is used as a reference (reference electrode) will be placed near the recording electrode.  A paste or gel will be applied to your skin between the recording electrode and the reference electrode.  Your nerve will be stimulated with a mild shock. Your health care provider will measure how much time it takes for your muscle to react.  Your health care provider  will remove the electrodes and the gel when the procedure is finished. The procedure may vary among health care providers and hospitals. AFTER THE PROCEDURE  It is your responsibility to obtain your test results. Ask your health care provider or the department performing the test when and how you will get your results.  Your health care provider may:  Give you medicines for any  pain.  Monitor the insertion sites to make sure that they stop bleeding.   This information is not intended to replace advice given to you by your health care provider. Make sure you discuss any questions you have with your health care provider.   Document Released: 11/05/2004 Document Revised: 11/19/2014 Document Reviewed: 08/26/2014 Elsevier Interactive Patient Education [employer]

## 2016-05-03 ENCOUNTER — Other Ambulatory Visit: Payer: Self-pay | Admitting: Family Medicine

## 2016-05-03 DIAGNOSIS — M47812 Spondylosis without myelopathy or radiculopathy, cervical region: Secondary | ICD-10-CM | POA: Diagnosis not present

## 2016-05-03 DIAGNOSIS — G8929 Other chronic pain: Secondary | ICD-10-CM | POA: Diagnosis not present

## 2016-05-04 ENCOUNTER — Ambulatory Visit (INDEPENDENT_AMBULATORY_CARE_PROVIDER_SITE_OTHER): Payer: BLUE CROSS/BLUE SHIELD | Admitting: Family Medicine

## 2016-05-04 ENCOUNTER — Encounter: Payer: Self-pay | Admitting: Family Medicine

## 2016-05-04 VITALS — BP 109/61 | HR 78 | Temp 97.1°F | Wt 170.0 lb

## 2016-05-04 DIAGNOSIS — R05 Cough: Secondary | ICD-10-CM

## 2016-05-04 DIAGNOSIS — R059 Cough, unspecified: Secondary | ICD-10-CM

## 2016-05-04 MED ORDER — AZITHROMYCIN 250 MG PO TABS: 250.0000 mg | ORAL_TABLET | Freq: Every day | ORAL | 0 refills | Status: DC

## 2016-05-04 MED ORDER — PREDNISONE 10 MG PO TABS: 30.0000 mg | ORAL_TABLET | Freq: Every day | ORAL | 0 refills | Status: DC

## 2016-05-04 MED ORDER — BENZONATATE 200 MG PO CAPS: 200.0000 mg | ORAL_CAPSULE | Freq: Three times a day (TID) | ORAL | 0 refills | Status: DC | PRN

## 2016-05-04 MED ORDER — GUAIFENESIN-CODEINE 100-10 MG/5ML PO SOLN: 5.0000 mL | Freq: Every evening | ORAL | 0 refills | Status: DC | PRN

## 2016-05-04 NOTE — Patient Instructions (Signed)
Thank you for coming in today. Call or go to the emergency room if you get worse, have trouble breathing, have chest pains, or palpitations.  Use codeine as needed at night.  Take prednisone or azithromycin as needed.    Acute Bronchitis Bronchitis is inflammation of the airways that extend from the windpipe into the lungs (bronchi). The inflammation often causes mucus to develop. This leads to a cough, which is the most common symptom of bronchitis.  In acute bronchitis, the condition usually develops suddenly and goes away over time, usually in a couple weeks. Smoking, allergies, and asthma can make bronchitis worse. Repeated episodes of bronchitis may cause further lung problems.  CAUSES Acute bronchitis is most often caused by the same virus that causes a cold. The virus can spread from person to person (contagious) through coughing, sneezing, and touching contaminated objects. SIGNS AND SYMPTOMS   Cough.   Fever.   Coughing up mucus.   Body aches.   Chest congestion.   Chills.   Shortness of breath.   Sore throat.  DIAGNOSIS  Acute bronchitis is usually diagnosed through a physical exam. Your health care provider will also ask you questions about your medical history. Tests, such as chest X-rays, are sometimes done to rule out other conditions.  TREATMENT  Acute bronchitis usually goes away in a couple weeks. Oftentimes, no medical treatment is necessary. Medicines are sometimes given for relief of fever or cough. Antibiotic medicines are usually not needed but may be prescribed in certain situations. In some cases, an inhaler may be recommended to help reduce shortness of breath and control the cough. A cool mist vaporizer may also be used to help thin bronchial secretions and make it easier to clear the chest.  HOME CARE INSTRUCTIONS  Get plenty of rest.   Drink enough fluids to keep your urine clear or pale yellow (unless you have a medical condition that requires  fluid restriction). Increasing fluids may help thin your respiratory secretions (sputum) and reduce chest congestion, and it will prevent dehydration.   Take medicines only as directed by your health care provider.  If you were prescribed an antibiotic medicine, finish it all even if you start to feel better.  Avoid smoking and secondhand smoke. Exposure to cigarette smoke or irritating chemicals will make bronchitis worse. If you are a smoker, consider using nicotine gum or skin patches to help control withdrawal symptoms. Quitting smoking will help your lungs heal faster.   Reduce the chances of another bout of acute bronchitis by washing your hands frequently, avoiding people with cold symptoms, and trying not to touch your hands to your mouth, nose, or eyes.   Keep all follow-up visits as directed by your health care provider.  SEEK MEDICAL CARE IF: Your symptoms do not improve after 1 week of treatment.  SEEK IMMEDIATE MEDICAL CARE IF:  You develop an increased fever or chills.   You have chest pain.   You have severe shortness of breath.  You have bloody sputum.   You develop dehydration.  You faint or repeatedly feel like you are going to pass out.  You develop repeated vomiting.  You develop a severe headache. MAKE SURE YOU:   Understand these instructions.  Will watch your condition.  Will get help right away if you are not doing well or get worse.   This information is not intended to replace advice given to you by your health care provider. Make sure you discuss any questions you have  with your health care provider.   Document Released: 08/12/2004 Document Revised: 07/26/2014 Document Reviewed: 12/26/2012 Elsevier Interactive Patient Education [employer].

## 2016-05-05 NOTE — Progress Notes (Signed)
       Adam Norton is a 40 y.o. male who [name] today for cough congestion runny nose. Symptoms present for 3 days. Additionally patient notes headache and ear pressure. No significant wheezing or shortness of breath. He's tried some over-the-counter medicines which helped a bit. No vomiting or diarrhea.   No past medical history on file. Past Surgical History:  Procedure Laterality Date  . KIDNEY STONE SURGERY     Social History  Substance Use Topics  . Smoking status: Current Some Day Smoker  . Smokeless tobacco: Not on file  . Alcohol use 0.0 oz/week   family history includes Alcohol abuse in his brother and father; Diabetes in his father.  ROS as above:  Medications: Current Outpatient Prescriptions  Medication Sig Dispense Refill  . loratadine (CLARITIN) 10 MG tablet Take 10 mg by mouth daily.    . sertraline (ZOLOFT) 100 MG tablet Take 1 tablet (100 mg total) by mouth daily. 90 tablet 2  . traZODone (DESYREL) 100 MG tablet Take 1 tablet (100 mg total) by mouth at bedtime as needed. for sleep 90 tablet 2  . azithromycin (ZITHROMAX) 250 MG tablet Take 1 tablet (250 mg total) by mouth daily. Take first 2 tablets together, then 1 every day until finished. 6 tablet 0  . benzonatate (TESSALON) 200 MG capsule Take 1 capsule (200 mg total) by mouth 3 (three) times daily as needed for cough. 45 capsule 0  . gabapentin (NEURONTIN) 300 MG capsule Take 1 capsule (300 mg total) by mouth at bedtime. (Patient not taking: Reported on 05/04/2016) 90 capsule 3  . guaiFENesin-codeine 100-10 MG/5ML syrup Take 5 mLs by mouth at bedtime as needed for cough. 120 mL 0  . predniSONE (DELTASONE) 10 MG tablet Take 3 tablets (30 mg total) by mouth daily with breakfast. 15 tablet 0   No current facility-administered medications for this visit.    No Known  Allergies  Health Maintenance Health Maintenance  Topic Date Due  . HIV Screening  08/28/1988  . TETANUS/TDAP  08/17/2025  . INFLUENZA VACCINE  Completed     Exam:  BP 109/61   Pulse 78   Temp 97.1 F (36.2 C) (Oral)   Wt 170 lb (77.1 kg)   SpO2 98%  Gen: Well NAD HEENT: EOMI,  MMM Clear nasal discharge. Posterior pharynx with mild cobblestoning. Normal tympanic membranes bilaterally. No cervical lymphadenopathy present. Lungs: Normal work of breathing. CTABL Heart: RRR no MRG Abd: NABS, Soft. Nondistended, Nontender Exts: Brisk capillary refill, warm and well perfused.    No results found for this or any previous visit (from the past 72 hour(s)). No results found.    Assessment and Plan: 40 y.o. male with viral URI versus seasonal allergies. Linda treat symptomatically with [employer] Perles and codeine cough syrup as well as over-the-counter Claritin and Mucinex. Use backup prednisone and azithromycin as needed.   No orders of the defined types were placed in this encounter.   Discussed warning signs or symptoms. Please see discharge instructions. Patient expresses understanding.

## 2016-05-14 ENCOUNTER — Telehealth: Payer: Self-pay

## 2016-05-14 NOTE — Telephone Encounter (Signed)
We received paperwork for pt. Please contact pt to schedule appt to complete paperwork per Dr. Denyse Amassorey.

## 2016-05-17 ENCOUNTER — Ambulatory Visit (INDEPENDENT_AMBULATORY_CARE_PROVIDER_SITE_OTHER): Payer: BLUE CROSS/BLUE SHIELD | Admitting: Family Medicine

## 2016-05-17 ENCOUNTER — Encounter: Payer: Self-pay | Admitting: Family Medicine

## 2016-05-17 VITALS — BP 110/69 | HR 79 | Ht 66.54 in | Wt 171.0 lb

## 2016-05-17 DIAGNOSIS — M1288 Other specific arthropathies, not elsewhere classified, other specified site: Secondary | ICD-10-CM | POA: Diagnosis not present

## 2016-05-17 DIAGNOSIS — M5412 Radiculopathy, cervical region: Secondary | ICD-10-CM | POA: Diagnosis not present

## 2016-05-17 DIAGNOSIS — F3342 Major depressive disorder, recurrent, in full remission: Secondary | ICD-10-CM | POA: Diagnosis not present

## 2016-05-17 DIAGNOSIS — M47812 Spondylosis without myelopathy or radiculopathy, cervical region: Secondary | ICD-10-CM

## 2016-05-17 MED ORDER — TRAZODONE HCL 100 MG PO TABS: 100.0000 mg | ORAL_TABLET | Freq: Every evening | ORAL | 3 refills | Status: DC | PRN

## 2016-05-17 MED ORDER — SERTRALINE HCL 100 MG PO TABS: 100.0000 mg | ORAL_TABLET | Freq: Every day | ORAL | 3 refills | Status: DC

## 2016-05-17 NOTE — Progress Notes (Signed)
       Adam Norton is a 40 y.o. male who [name] today for follow up shoulder and neck pain as well as fill out FMLA forms. He's doing reasonably well from both his left shoulder bursitis as well as his cervical radicular symptoms with intermittent injections and therapy. He notes that he needs to miss work to attend visits or therapy sessions occasionally. He will also occasionally have a flareup. However he is doing quite well generally.   No past medical history on file. Past Surgical History:  Procedure Laterality Date  . KIDNEY STONE SURGERY     Social History  Substance Use Topics  . Smoking status: Current Some Day Smoker  . Smokeless tobacco: Not on file  . Alcohol use 0.0 oz/week   family history includes Alcohol abuse in his brother and father; Diabetes in his father.  ROS as above:  Medications: Current Outpatient Prescriptions  Medication Sig Dispense Refill  . azithromycin (ZITHROMAX) 250 MG tablet Take 1 tablet (250 mg total) by mouth daily. Take first 2 tablets together, then 1 every day until finished. 6 tablet 0  . benzonatate (TESSALON) 200 MG capsule Take 1 capsule (200 mg total) by mouth 3 (three) times daily as needed for cough. 45 capsule 0  . guaiFENesin-codeine 100-10 MG/5ML syrup Take 5 mLs by mouth at bedtime as needed for cough. 120 mL 0  . loratadine (CLARITIN) 10 MG tablet Take 10 mg by mouth daily.    . predniSONE (DELTASONE) 10 MG tablet Take 3 tablets (30 mg total) by mouth daily with breakfast. 15 tablet 0  . sertraline (ZOLOFT) 100 MG tablet Take 1 tablet (100 mg total) by mouth daily. 90 tablet 3  . traZODone (DESYREL) 100 MG tablet Take 1 tablet (100 mg total) by mouth at bedtime as needed. for sleep 90 tablet 3   No current facility-administered medications for this visit.    No Known Allergies  Health  Maintenance Health Maintenance  Topic Date Due  . HIV Screening  08/28/1988  . TETANUS/TDAP  08/17/2025  . INFLUENZA VACCINE  Completed     Exam:  BP 110/69   Pulse 79   Ht 5' 6.53" (1.69 m)   Wt 171 lb (77.6 kg)   BMI 27.16 kg/m  Body mass index is 27.16 kg/m.  Gen: Well NAD HEENT: EOMI,  MMM Lungs: Normal work of breathing. CTABL Heart: RRR no MRG Abd: NABS, Soft. Nondistended, Nontender Exts: Brisk capillary refill, warm and well perfused.    No results found for this or any previous visit (from the past 72 hour(s)). No results found.    Assessment and Plan: 40 y.o. male with follow-up neck and shoulder pain. Doing well. Continue current treatment program. Medications refilled today.   No orders of the defined types were placed in this encounter.   Discussed warning signs or symptoms. Please see discharge instructions. Patient expresses understanding.

## 2016-05-17 NOTE — Patient Instructions (Signed)
Thank you for coming in today. Return in 6 months or sooner if needed.   Body mass index is 27.16 kg/m.

## 2016-05-24 DIAGNOSIS — M47812 Spondylosis without myelopathy or radiculopathy, cervical region: Secondary | ICD-10-CM | POA: Diagnosis not present

## 2016-05-27 ENCOUNTER — Encounter (INDEPENDENT_AMBULATORY_CARE_PROVIDER_SITE_OTHER): Payer: Self-pay | Admitting: Diagnostic Neuroimaging

## 2016-05-27 ENCOUNTER — Ambulatory Visit (INDEPENDENT_AMBULATORY_CARE_PROVIDER_SITE_OTHER): Payer: BLUE CROSS/BLUE SHIELD | Admitting: Diagnostic Neuroimaging

## 2016-05-27 DIAGNOSIS — M79602 Pain in left arm: Secondary | ICD-10-CM

## 2016-05-27 DIAGNOSIS — M79601 Pain in right arm: Secondary | ICD-10-CM

## 2016-05-27 DIAGNOSIS — M542 Cervicalgia: Secondary | ICD-10-CM

## 2016-05-27 DIAGNOSIS — Z0289 Encounter for other administrative examinations: Secondary | ICD-10-CM

## 2016-05-27 NOTE — Procedures (Signed)
   [facility]  NCS (NERVE CONDUCTION STUDY) WITH EMG (ELECTROMYOGRAPHY) REPORT   STUDY DATE: 05/27/16 PATIENT NAME: Adam Norton DOB: Dec 05, 1975 MRN: [id number]  ORDERING CLINICIAN: Clementeen GrahamEvan Corey, MD  TECHNOLOGIST: Gearldine ShownLorraine Jones  ELECTROMYOGRAPHER: Glenford BayleyVikram R. Penumalli, MD  CLINICAL INFORMATION: 40 year old male with neck pain and bilateral arm pain and numbness.  FINDINGS: NERVE CONDUCTION STUDY: Bilateral median and ulnar motor responses and F wave latencies are normal.  Bilateral median and ulnar sensory responses are normal.   NEEDLE ELECTROMYOGRAPHY: Left upper extremity deltoid, biceps, triceps, flexor carpi radialis, first dorsal interosseous muscles have no abnormal spontaneous activity at rest and normal motor unit recruitment on exertion.  Left C5-6 and C7-T1 paraspinal muscles have no abnormal spontaneous activity.   IMPRESSION:  This is a normal study. No electrodiagnostic evidence of cervical radiculopathy, large fiber neuropathy or myopathy at this time.    INTERPRETING PHYSICIAN:  Suanne MarkerVIKRAM R. PENUMALLI, MD Certified in Neurology, Neurophysiology and Neuroimaging  [facility] [address], [address] [place], [place] [address] [phone]

## 2016-06-15 ENCOUNTER — Encounter: Payer: Self-pay | Admitting: Family Medicine

## 2016-06-15 ENCOUNTER — Ambulatory Visit (INDEPENDENT_AMBULATORY_CARE_PROVIDER_SITE_OTHER): Payer: BLUE CROSS/BLUE SHIELD | Admitting: Family Medicine

## 2016-06-15 VITALS — BP 118/64 | HR 86 | Wt 168.0 lb

## 2016-06-15 DIAGNOSIS — M25512 Pain in left shoulder: Secondary | ICD-10-CM

## 2016-06-15 NOTE — Patient Instructions (Signed)
Thank you for coming in today. Follow up with Dr Pill.  Return as needed.   Continue the exercises.    Secondary Shoulder Impingement Syndrome Rehab Ask your health care provider which exercises are safe for you. Do exercises exactly as told by your health care provider and adjust them as directed. It is normal to feel mild stretching, pulling, tightness, or discomfort as you do these exercises, but you should stop right away if you feel sudden pain or your pain gets worse. Do not begin these exercises until told by your health care provider. Stretching and range of motion exercise This exercise warms up your muscles and joints and improves the movement and flexibility of your neck and shoulder. This exercise also helps to relieve pain and stiffness. Exercise A: Cervical side bend 1. Using good posture, sit on a stable chair, or stand up. 2. Without moving your shoulders, slowly tilt your left / right ear toward your left / right shoulder until you feel a stretch in your neck muscles. You should be looking straight ahead. 3. Hold for __________ seconds. 4. Slowly return to the starting position. 5. Repeat on your left / right side. Repeat __________ times. Complete this exercise __________ times a day. Strengthening exercises These exercises build strength and endurance in your shoulder. Endurance is the ability to use your muscles for a long time, even after they get tired. Exercise B: Scapular protraction, supine 1. Lie on your back on a firm surface. Hold a __________ weight in your left / right hand. 2. Raise your left / right arm straight into the air so your hand is directly above your shoulder joint. 3. Push the weight into the air so your shoulder lifts off of the surface that you are lying on. Do not move your head, neck, or back. 4. Hold for __________ seconds. 5. Slowly return to the starting position. Let your muscles relax completely before you repeat this exercise. Repeat  __________ times. Complete this exercise __________ times a day. Exercise C: Scapular retraction 1. Sit in a stable chair without armrests, or stand. 2. Secure an exercise band to a stable object in front of you so the band is at shoulder height. 3. Hold one end of the exercise band in each hand. Your palms should face down. 4. Squeeze your shoulder blades together and move your elbows slightly behind you. Do not shrug your shoulders while you do this. 5. Hold for __________ seconds. 6. Slowly return to the starting position. Repeat __________ times. Complete this exercise __________ times a day. Exercise D: Shoulder extension with scapular retraction 1. Sit in a stable chair without armrests, or stand. 2. Secure an exercise band to a stable object in front of you where it is above shoulder height. 3. Hold one end of the exercise band in each hand. 4. Straighten your elbows and lift your hands up to shoulder height. 5. Squeeze your shoulder blades together and pull your hands down to the sides of your thighs. Stop when your hands are straight down by your sides. Do not let your hands go behind your body. 6. Hold for __________ seconds. 7. Slowly return to the starting position. Repeat __________ times. Complete this exercise __________ times a day. Exercise E: Shoulder abduction 1. Sit in a stable chair without armrests, or stand. 2. If directed, hold a __________ weight in your left / right hand. 3. Start with your arms straight down. Turn your left / right hand so your palm faces  in, toward your body. 4. Slowly lift your left / right hand out to your side. Do not lift your hand above shoulder height.  Keep your arms straight.  Avoid shrugging your shoulder while you do this movement. Keep your shoulder blade tucked down toward the middle of your back. 5. Hold for __________ seconds. 6. Slowly lower your arm, and return to the starting position. Repeat __________ times. Complete this  exercise __________ times a day. This information is not intended to replace advice given to you by your health care provider. Make sure you discuss any questions you have with your health care provider. Document Released: 07/05/2005 Document Revised: 03/11/2016 Document Reviewed: 06/07/2015 Elsevier Interactive Patient Education  2017 [employer]

## 2016-06-15 NOTE — Progress Notes (Signed)
Adam Norton is a 40 y.o. male who [name] today for follow up shoulder pain.   [facility]. Pain is worse in the lateral upper arm and worse with overhead motion reaching back. He also notes pain when he crosses his arm in front of his body. He denies significant pain radiating down the left arm currently. He denies significant weakness or numbness. Symptoms are moderate and do interfere with activity. Sometimes they are quite severe. He's had a ultrasound-guided subacromial injection that worked very well for about a day or 2 and subsequently has not worked well. Additionally set some physical therapy which helped a little. He MRI in August that was essentially normal and recently had a normal nerve conduction study. He is very frustrated with this pain.  Additionally he still occasionally receives treatment for right worse than left cervical radiculopathy. He receives epidural steroid injections which do help.     No past medical history on file. Past Surgical History:  Procedure Laterality Date  . KIDNEY STONE SURGERY     Social History  Substance Use Topics  . Smoking status: Current Some Day Smoker  . Smokeless tobacco: Not on file  . Alcohol use 0.0 oz/week   family history includes Alcohol abuse in his brother and father; Diabetes in his father.  ROS as above:  Medications: Current Outpatient Prescriptions  Medication Sig Dispense Refill  . loratadine (CLARITIN) 10 MG tablet Take 10 mg by mouth daily.    . sertraline (ZOLOFT) 100 MG tablet Take 1 tablet (100 mg total) by mouth daily. 90 tablet 3  . traZODone (DESYREL) 100 MG tablet Take 1 tablet (100 mg total) by mouth at bedtime as needed. for sleep 90 tablet 3   No current facility-administered medications for this visit.    No Known  Allergies  Health Maintenance Health Maintenance  Topic Date Due  . HIV Screening  08/28/1988  . TETANUS/TDAP  08/17/2025  . INFLUENZA VACCINE  Completed     Exam:  BP 118/64   Pulse 86   Wt 168 lb (76.2 kg)   BMI 26.68 kg/m  Gen: Well NAD Left shoulder normal-appearing. Mildly tender to palpation overlying the acromioclavicular joint. Normal arm motion however pain with abduction beyond 100 and with internal rotation beyond the lumbar spine. Positive Hawkins and Neer's test. Positive empty can test. Positive crossover arm compression test. Negative O'Brien test. Negative Yergason's and speeds test.   NCS (NERVE CONDUCTION STUDY) WITH EMG (ELECTROMYOGRAPHY) REPORT    STUDY DATE: 05/27/16 PATIENT NAME: Adam RueGregory Figueira DOB: 24-Apr-1976 MRN: [id number]  ORDERING CLINICIAN: Clementeen GrahamEvan Lalita Ebel, MD  TECHNOLOGIST: Gearldine ShownLorraine Jones  ELECTROMYOGRAPHER: Glenford BayleyVikram R. Penumalli, MD  CLINICAL INFORMATION: 40 year old male with neck pain and bilateral arm pain and numbness.  FINDINGS: NERVE CONDUCTION STUDY: Bilateral median and ulnar motor responses and F wave latencies are normal.  Bilateral median and ulnar sensory responses are normal.   NEEDLE ELECTROMYOGRAPHY: Left upper extremity deltoid, biceps, triceps, flexor carpi radialis, first dorsal interosseous muscles have no abnormal spontaneous activity at rest and normal motor unit recruitment on exertion.  Left C5-6 and C7-T1 paraspinal muscles have no abnormal spontaneous activity.   IMPRESSION:  This is a normal study. No electrodiagnostic evidence of cervical radiculopathy, large fiber neuropathy or myopathy at this time.     INTERPRETING PHYSICIAN:  Suanne MarkerVIKRAM R. PENUMALLI, MD Certified in Neurology, Neurophysiology and Neuroimaging  [facility] [address], [address] [place], [place] [address] [phone]   Study Result   CLINICAL DATA:  1.  EXAM: MRI OF THE LEFT SHOULDER WITHOUT  CONTRAST  TECHNIQUE: Multiplanar, multisequence MR imaging of the shoulder was performed. No intravenous contrast was administered.  COMPARISON:  01/05/2016  FINDINGS: Rotator cuff:  Unremarkable  Muscles:  Unremarkable  Biceps long head:  Unremarkable  Acromioclavicular Joint: No significant arthropathy. Subacromial morphology is type 2 (curved). Trace and likely physiologic fluid in the subacromial subdeltoid bursa.  Glenohumeral Joint: Unremarkable  Labrum: Anterior superior sublabral foramen noted. No discrete labral tear identified.  Bones: No significant extra-articular osseous abnormalities identified.  Other: No supplemental non-categorized findings.  IMPRESSION: 1. No specific internal derangement is identified to further explain the patient's left shoulder soreness and pain.   Electronically Signed   By: Gaylyn RongWalter  Liebkemann M.D.   On: 02/23/2016 11:40     No results found for this or any previous visit (from the past 72 hour(s)). No results found.    Assessment and Plan: 40 y.o. male with  Left shoulder pain. Symptoms are very consistent with supraspinatus impingement. Additionally he has likely some component of acromioclavicular DJD. He's had reasonable treatments which have not worked very well additionally he's had a normal MRI. The diagnostic subacromial injection with very helpful and did relieve his pain but only for a few hours. The steroid component didn't seem to help at all. At this point we discussed options. He would like referral to orthopedic surgery for potential evaluation of subacromial debridement.   Return as needed.   Orders Placed This Encounter  Procedures  . Ambulatory referral to Orthopedic Surgery    Referral Priority:   Routine    Referral Type:   Surgical    Referral Reason:   Specialty Services Required    Referred to Provider:   Newton PiggStephen G Pill, MD    Requested Specialty:   Orthopedic Surgery    Number of  Visits Requested:   1    Discussed warning signs or symptoms. Please see discharge instructions. Patient expresses understanding.

## 2016-06-17 DIAGNOSIS — M25512 Pain in left shoulder: Secondary | ICD-10-CM | POA: Diagnosis not present

## 2016-06-17 DIAGNOSIS — M542 Cervicalgia: Secondary | ICD-10-CM | POA: Diagnosis not present

## 2016-06-24 DIAGNOSIS — M19012 Primary osteoarthritis, left shoulder: Secondary | ICD-10-CM | POA: Diagnosis not present

## 2016-06-24 DIAGNOSIS — M25512 Pain in left shoulder: Secondary | ICD-10-CM | POA: Diagnosis not present

## 2016-06-30 DIAGNOSIS — M791 Myalgia: Secondary | ICD-10-CM | POA: Diagnosis not present

## 2016-06-30 DIAGNOSIS — M542 Cervicalgia: Secondary | ICD-10-CM | POA: Diagnosis not present

## 2016-06-30 DIAGNOSIS — M25512 Pain in left shoulder: Secondary | ICD-10-CM | POA: Diagnosis not present

## 2016-07-21 ENCOUNTER — Ambulatory Visit: Payer: BLUE CROSS/BLUE SHIELD | Admitting: Family Medicine

## 2016-08-05 ENCOUNTER — Other Ambulatory Visit: Payer: Self-pay | Admitting: Family Medicine

## 2016-08-23 ENCOUNTER — Ambulatory Visit (INDEPENDENT_AMBULATORY_CARE_PROVIDER_SITE_OTHER): Payer: BLUE CROSS/BLUE SHIELD

## 2016-08-23 ENCOUNTER — Ambulatory Visit (INDEPENDENT_AMBULATORY_CARE_PROVIDER_SITE_OTHER): Payer: BLUE CROSS/BLUE SHIELD | Admitting: Family Medicine

## 2016-08-23 VITALS — BP 110/65 | HR 72 | Wt 163.0 lb

## 2016-08-23 DIAGNOSIS — M1288 Other specific arthropathies, not elsewhere classified, other specified site: Secondary | ICD-10-CM

## 2016-08-23 DIAGNOSIS — M25512 Pain in left shoulder: Secondary | ICD-10-CM

## 2016-08-23 DIAGNOSIS — M5412 Radiculopathy, cervical region: Secondary | ICD-10-CM

## 2016-08-23 DIAGNOSIS — Z0000 Encounter for general adult medical examination without abnormal findings: Secondary | ICD-10-CM | POA: Diagnosis not present

## 2016-08-23 DIAGNOSIS — M47812 Spondylosis without myelopathy or radiculopathy, cervical region: Secondary | ICD-10-CM

## 2016-08-23 DIAGNOSIS — R61 Generalized hyperhidrosis: Secondary | ICD-10-CM

## 2016-08-23 DIAGNOSIS — E538 Deficiency of other specified B group vitamins: Secondary | ICD-10-CM

## 2016-08-23 DIAGNOSIS — E559 Vitamin D deficiency, unspecified: Secondary | ICD-10-CM

## 2016-08-23 NOTE — Progress Notes (Addendum)
Adam Norton is a 41 y.o. male who [name] today for well adult visit.  Patient presents to clinic today for annual wellness exam additionally with paperwork for continued workplace restrictions from his neck and shoulder pain.  He is feeling reasonably well. He notes that he continues to experience occasional insomnia. He takes trazodone which certainly helps. He notes that his anxiety typically is pretty well controlled with Zoloft.   He does note continued bilateral neck and shoulder pain. He's had an extensive workup with no clear explanation. He's no longer able to lift or push [place] about 30 pounds without having significant pain. When asked about constitutional symptoms he does note occasional night sweats but denies weight loss shortness of breath cough or congestion.   No past medical history on file. Past Surgical History:  Procedure Laterality Date  . KIDNEY STONE SURGERY     Social History  Substance Use Topics  . Smoking status: Current Some Day Smoker  . Smokeless tobacco: Not on file  . Alcohol use 0.0 oz/week   family history includes Alcohol abuse in his brother and father; Diabetes in his father.  ROS as above:  Medications: Current Outpatient Prescriptions  Medication Sig Dispense Refill  . loratadine (CLARITIN) 10 MG tablet Take 10 mg by mouth daily.    . sertraline (ZOLOFT) 100 MG tablet Take 1 tablet (100 mg total) by mouth daily. 90 tablet 3  . traZODone (DESYREL) 100 MG tablet Take 1 tablet (100 mg total) by mouth at bedtime as needed. for sleep 90 tablet 3   No current facility-administered medications for this visit.    No Known Allergies  Health Maintenance Health Maintenance  Topic Date Due  . HIV Screening  08/28/1988  . TETANUS/TDAP  08/17/2025  . INFLUENZA VACCINE  Completed     Exam:  BP 110/65   Pulse  72   Wt 163 lb (73.9 kg)   BMI 25.89 kg/m  Waist Circumference 38 inches Gen: Well NAD HEENT: EOMI,  MMM Lungs: Normal work of breathing. CTABL Heart: RRR no MRG Abd: NABS, Soft. Nondistended, Nontender Exts: Brisk capillary refill, warm and well perfused.  MSK: Normal spinal motion. Upper extremity motion is intact throughout however patient does experience pain with abduction beyond about 120. Upper summary strength is equal and normal throughout however patient does express pain with shoulder shrug and most shoulder motion strength tests. Pulses capillary Refill sensation intact distally.  [facility] 08/23/2016  Decreased Interest 0  Down, Depressed, Hopeless 0  PHQ - 2 Score 0  Altered sleeping 3  Tired, decreased energy 1  Change in appetite 0  Feeling bad or failure about yourself  0  Trouble concentrating 1  Moving slowly or fidgety/restless 0  Suicidal thoughts 0  PHQ-9 Score 5      No results found for this or any previous visit (from the past 72 hour(s)). Dg Chest 2 View  Result Date: 08/23/2016 CLINICAL DATA:  LEFT shoulder pain since June, night sweats, smoker, LEFT cervical and RIGHT cervical radiculopathy EXAM: CHEST  2 VIEW COMPARISON:  None FINDINGS: Normal heart size, mediastinal contours, and pulmonary vascularity. Lungs clear. No pleural effusion or pneumothorax. Bones unremarkable. IMPRESSION: No acute abnormalities. Electronically Signed   By: Ulyses SouthwardMark  Boles M.D.   On: 08/23/2016 17:24      Assessment and Plan: 41 y.o. male with  Will exam. Doing well. Work note form  filled out and sent to scanned. I don't have a clear explanation for shoulder and neck pain however I do think continue work  restrictions are reasonable. As noted above chest x-ray is unremarkable therefore I think a pancoast tumor is very unlikely. I think the most likely explanation is probably shoulder bursitis. I recommended that he follow up with orthopedic surgeon for second  opinion.  Basic fasting labs ordered. Orders Placed This Encounter  Procedures  . DG Chest 2 View    Order Specific Question:   Reason for exam:    Answer:   Left shoulder pain without clear cause. Night sweats and smoking hx    Order Specific Question:   Preferred imaging location?    Answer:   [facility]  . CBC with Differential/Platelet  . COMPLETE METABOLIC PANEL WITH GFR  . Lipid panel  . Vitamin B12  . VITAMIN D 25 Hydroxy (Vit-D Deficiency, Fractures)   No orders of the defined types were placed in this encounter.    Discussed warning signs or symptoms. Please see discharge instructions. Patient expresses understanding.

## 2016-08-23 NOTE — Patient Instructions (Signed)
Thank you for coming in today. Get xray today.  Get fasting labs soon.  Recheck in 6 months or sooner if needed.  Call or go to the emergency room if you get worse, have trouble breathing, have chest pains, or palpitations.

## 2016-08-27 DIAGNOSIS — E559 Vitamin D deficiency, unspecified: Secondary | ICD-10-CM | POA: Diagnosis not present

## 2016-08-27 DIAGNOSIS — M25512 Pain in left shoulder: Secondary | ICD-10-CM | POA: Diagnosis not present

## 2016-08-27 DIAGNOSIS — M1288 Other specific arthropathies, not elsewhere classified, other specified site: Secondary | ICD-10-CM | POA: Diagnosis not present

## 2016-08-27 DIAGNOSIS — E538 Deficiency of other specified B group vitamins: Secondary | ICD-10-CM | POA: Diagnosis not present

## 2016-08-27 LAB — CBC WITH DIFFERENTIAL/PLATELET
BASOS PCT: 0 %
Basophils Absolute: 0 cells/uL (ref 0–200)
EOS ABS: 94 {cells}/uL (ref 15–500)
Eosinophils Relative: 1 %
HEMATOCRIT: 46.5 % (ref 38.5–50.0)
HEMOGLOBIN: 15.5 g/dL (ref 13.2–17.1)
LYMPHS ABS: 1880 {cells}/uL (ref 850–3900)
Lymphocytes Relative: 20 %
MCH: 29.4 pg (ref 27.0–33.0)
MCHC: 33.3 g/dL (ref 32.0–36.0)
MCV: 88.2 fL (ref 80.0–100.0)
MONO ABS: 846 {cells}/uL (ref 200–950)
MPV: 9 fL (ref 7.5–12.5)
Monocytes Relative: 9 %
Neutro Abs: 6580 cells/uL (ref 1500–7800)
Neutrophils Relative %: 70 %
Platelets: 461 10*3/uL — ABNORMAL HIGH (ref 140–400)
RBC: 5.27 MIL/uL (ref 4.20–5.80)
RDW: 13.9 % (ref 11.0–15.0)
WBC: 9.4 10*3/uL (ref 3.8–10.8)

## 2016-08-28 LAB — COMPLETE METABOLIC PANEL WITH GFR
ALBUMIN: 4.3 g/dL (ref 3.6–5.1)
ALT: 13 U/L (ref 9–46)
AST: 14 U/L (ref 10–40)
Alkaline Phosphatase: 93 U/L (ref 40–115)
BILIRUBIN TOTAL: 0.5 mg/dL (ref 0.2–1.2)
BUN: 11 mg/dL (ref 7–25)
CALCIUM: 9.5 mg/dL (ref 8.6–10.3)
CHLORIDE: 106 mmol/L (ref 98–110)
CO2: 26 mmol/L (ref 20–31)
CREATININE: 0.97 mg/dL (ref 0.60–1.35)
GFR, Est Non African American: >89 mL/min (ref >=60)
Glucose, Bld: 81 mg/dL (ref 65–99)
Potassium: 4.5 mmol/L (ref 3.5–5.3)
Sodium: 139 mmol/L (ref 135–146)
TOTAL PROTEIN: 7 g/dL (ref 6.1–8.1)

## 2016-08-28 LAB — LIPID PANEL
CHOLESTEROL: 217 mg/dL — AB (ref <200)
HDL: 35 mg/dL — ABNORMAL LOW (ref >40)
LDL Cholesterol: 159 mg/dL — ABNORMAL HIGH (ref <100)
Total CHOL/HDL Ratio: 6.2 Ratio — ABNORMAL HIGH (ref <5.0)
Triglycerides: 117 mg/dL (ref <150)
VLDL: 23 mg/dL (ref <30)

## 2016-08-28 LAB — VITAMIN D 25 HYDROXY (VIT D DEFICIENCY, FRACTURES): Vit D, 25-Hydroxy: 32 ng/mL (ref 30–100)

## 2016-08-28 LAB — VITAMIN B12: VITAMIN B 12: 492 pg/mL (ref 200–1100)

## 2016-09-06 ENCOUNTER — Emergency Department
Admission: EM | Admit: 2016-09-06 | Discharge: 2016-09-06 | Disposition: A | Discharge status: Home / Self Care | Payer: BLUE CROSS/BLUE SHIELD | Source: Home / Self Care | Attending: Family Medicine | Admitting: Family Medicine

## 2016-09-06 DIAGNOSIS — R69 Illness, unspecified: Secondary | ICD-10-CM | POA: Diagnosis not present

## 2016-09-06 DIAGNOSIS — J111 Influenza due to unidentified influenza virus with other respiratory manifestations: Secondary | ICD-10-CM

## 2016-09-06 MED ORDER — OSELTAMIVIR PHOSPHATE 75 MG PO CAPS: 75.0000 mg | ORAL_CAPSULE | Freq: Two times a day (BID) | ORAL | 0 refills | Status: DC

## 2016-09-06 MED ORDER — BENZONATATE 100 MG PO CAPS: 100.0000 mg | ORAL_CAPSULE | Freq: Three times a day (TID) | ORAL | 0 refills | Status: DC

## 2016-09-06 NOTE — Discharge Instructions (Signed)
°  You may take 400-600mg Ibuprofen (Motrin) every 6-8 hours for fever and pain  °Alternate with Tylenol  °You may take 500mg Tylenol every 4-6 hours as needed for fever and pain  °Follow-up with your primary care provider next week for recheck of symptoms if not improving.  °Be sure to drink plenty of fluids and rest, at least 8hrs of sleep a night, preferably more while you are sick. °Return urgent care or go to closest ER if you cannot keep down fluids/signs of dehydration, fever not reducing with Tylenol, difficulty breathing/wheezing, stiff neck, worsening condition, or other concerns (see below)  ° °Oseltamivir (Tamiflu) is an antiviral medication that can help decrease symptoms of the flu by about 1 days and lessen severity of symptoms.  It is best to start this medication within first 48 hours of symptoms, however, some studies have shown relief even after the 48 hour time frame.  This medication may cause stomach upset including nausea, vomiting and diarrhea.  It may also cause dizziness or hallucinations in children.  To help prevent stomach upset, you may take this medication with food.  If you are still having unwanted symptoms, you may stop taking this medication as it is not as important to finish the entire course like antibiotics.  If you have questions/concerns please call our office or follow up with your primary care provider.   ° °

## 2016-09-06 NOTE — ED Provider Notes (Signed)
CSN: [id number]     Arrival date & time 09/06/16  1613 History   First MD Initiated Contact with Patient 09/06/16 1629     Chief Complaint  Patient presents with  . Otalgia    pressure  . Headache    sinus  . Sore Throat  . Cough  . Generalized Body Aches   (Consider location/radiation/quality/duration/timing/severity/associated sxs/prior Treatment) HPI Adam Norton is a 41 y.o. male presenting to UC with c/o sudden onset body aches, sinus pressure in both ears, sinus headache, sore throat, and mildly productive cough with yellow mucous.  He has tried OTC mucinex with mild relief. Left ear is most bothersome for him. He did have the flu vaccine this year.  Denies n/v/d. No known sick contacts.     History reviewed. No pertinent past medical history. Past Surgical History:  Procedure Laterality Date  . KIDNEY STONE SURGERY     Family History  Problem Relation Age of Onset  . Alcohol abuse Father   . Diabetes Father   . Alcohol abuse Brother    Social History  Substance Use Topics  . Smoking status: Current Some Day Smoker  . Smokeless tobacco: Not on file  . Alcohol use 0.0 oz/week    Review of Systems  Constitutional: Negative for chills and fever.  HENT: Positive for congestion and ear pain. Negative for sore throat, trouble swallowing and voice change.   Respiratory: Positive for cough. Negative for shortness of breath.   Cardiovascular: Negative for chest pain and palpitations.  Gastrointestinal: Negative for abdominal pain, diarrhea, nausea and vomiting.  Musculoskeletal: Positive for arthralgias and myalgias. Negative for back pain.  Skin: Negative for rash.  Neurological: Positive for headaches. Negative for dizziness and light-headedness.    Allergies  Patient has no known allergies.  Home Medications   Prior to Admission medications   Medication Sig Start Date End Date Taking? Authorizing Provider  benzonatate (TESSALON) 100 MG capsule Take 1-2 capsules  (100-200 mg total) by mouth every 8 (eight) hours. 09/06/16   Junius Finner, PA-C  loratadine (CLARITIN) 10 MG tablet Take 10 mg by mouth daily.    Historical Provider, MD  oseltamivir (TAMIFLU) 75 MG capsule Take 1 capsule (75 mg total) by mouth every 12 (twelve) hours. 09/06/16   Junius Finner, PA-C  sertraline (ZOLOFT) 100 MG tablet Take 1 tablet (100 mg total) by mouth daily. 05/17/16   Rodolph Bong, MD  traZODone (DESYREL) 100 MG tablet Take 1 tablet (100 mg total) by mouth at bedtime as needed. for sleep 05/17/16   Rodolph Bong, MD   Meds Ordered and Administered this Visit  Medications - No data to display  BP 114/73 (BP Location: Left Arm)   Pulse 89   Temp 98.1 F (36.7 C) (Oral)   Ht 5\' 8"  (1.727 m)   Wt 161 lb (73 kg)   SpO2 97%   BMI 24.48 kg/m  No data found.   Physical Exam  Constitutional: He is oriented to person, place, and time. He appears well-developed and well-nourished. No distress.  HENT:  Head: Normocephalic and atraumatic.  Right Ear: Tympanic membrane normal.  Left Ear: Tympanic membrane normal.  Nose: Mucosal edema present.  Mouth/Throat: Uvula is midline, oropharynx is clear and moist and mucous membranes are normal.  Eyes: EOM are normal.  Neck: Normal range of motion. Neck supple.  Cardiovascular: Normal rate and regular rhythm.   Pulmonary/Chest: Effort normal and breath sounds normal. No stridor. No respiratory distress. He has  no wheezes. He has no rales.  Musculoskeletal: Normal range of motion.  Lymphadenopathy:    He has no cervical adenopathy.  Neurological: He is alert and oriented to person, place, and time.  Skin: Skin is warm and dry. He is not diaphoretic.  Psychiatric: He has a normal mood and affect. His behavior is normal.  Nursing note and vitals reviewed.   Urgent Care Course     Procedures (including critical care time)  Labs Review Labs Reviewed - No data to display  Imaging Review No results found.    MDM   1.  Influenza-like illness    Hx and exam c/w influenza w/o evidence of underlying bacterial infection at this time.  Discussed risks/benefits of Tamiflu. Pt would like to try the treatment. Rx: Tamiflu   Encouraged fluids, rest, acetaminophen and ibuprofen.  Encouraged f/u with PCP in 1 week if not improving, sooner if worsening.      Junius Finnerrin O'Malley, PA-C 09/06/16 1639

## 2016-09-06 NOTE — ED Triage Notes (Signed)
Pt started with sinus headache, pressure in the ears, sore throat, and a productive cough with yellow mucous.  He also has generalized body aches.

## 2016-09-27 DIAGNOSIS — S60111A Contusion of right thumb with damage to nail, initial encounter: Secondary | ICD-10-CM | POA: Diagnosis not present

## 2016-09-27 DIAGNOSIS — Z23 Encounter for immunization: Secondary | ICD-10-CM | POA: Diagnosis not present

## 2016-09-27 DIAGNOSIS — W231XXA Caught, crushed, jammed, or pinched between stationary objects, initial encounter: Secondary | ICD-10-CM | POA: Diagnosis not present

## 2016-09-30 DIAGNOSIS — Z79899 Other long term (current) drug therapy: Secondary | ICD-10-CM | POA: Diagnosis not present

## 2016-09-30 DIAGNOSIS — N134 Hydroureter: Secondary | ICD-10-CM | POA: Diagnosis not present

## 2016-09-30 DIAGNOSIS — N132 Hydronephrosis with renal and ureteral calculous obstruction: Secondary | ICD-10-CM | POA: Diagnosis not present

## 2016-09-30 DIAGNOSIS — N23 Unspecified renal colic: Secondary | ICD-10-CM | POA: Diagnosis not present

## 2016-09-30 DIAGNOSIS — N130 Hydronephrosis with ureteropelvic junction obstruction: Secondary | ICD-10-CM | POA: Diagnosis not present

## 2016-09-30 DIAGNOSIS — Z791 Long term (current) use of non-steroidal anti-inflammatories (NSAID): Secondary | ICD-10-CM | POA: Diagnosis not present

## 2016-09-30 DIAGNOSIS — Z87891 Personal history of nicotine dependence: Secondary | ICD-10-CM | POA: Diagnosis not present

## 2016-09-30 DIAGNOSIS — R110 Nausea: Secondary | ICD-10-CM | POA: Diagnosis not present

## 2016-10-06 ENCOUNTER — Other Ambulatory Visit: Payer: Self-pay | Admitting: Family Medicine

## 2016-10-06 DIAGNOSIS — F3342 Major depressive disorder, recurrent, in full remission: Secondary | ICD-10-CM

## 2016-10-13 DIAGNOSIS — N132 Hydronephrosis with renal and ureteral calculous obstruction: Secondary | ICD-10-CM | POA: Diagnosis not present

## 2016-10-13 DIAGNOSIS — N200 Calculus of kidney: Secondary | ICD-10-CM | POA: Diagnosis not present

## 2016-10-15 DIAGNOSIS — N201 Calculus of ureter: Secondary | ICD-10-CM | POA: Diagnosis not present

## 2016-10-15 DIAGNOSIS — F17200 Nicotine dependence, unspecified, uncomplicated: Secondary | ICD-10-CM | POA: Diagnosis not present

## 2016-10-15 DIAGNOSIS — Z87891 Personal history of nicotine dependence: Secondary | ICD-10-CM | POA: Diagnosis not present

## 2016-10-15 DIAGNOSIS — Z79899 Other long term (current) drug therapy: Secondary | ICD-10-CM | POA: Diagnosis not present

## 2016-10-15 DIAGNOSIS — R935 Abnormal findings on diagnostic imaging of other abdominal regions, including retroperitoneum: Secondary | ICD-10-CM | POA: Diagnosis not present

## 2016-10-15 DIAGNOSIS — R110 Nausea: Secondary | ICD-10-CM | POA: Diagnosis not present

## 2016-10-15 DIAGNOSIS — R1031 Right lower quadrant pain: Secondary | ICD-10-CM | POA: Diagnosis not present

## 2016-10-15 DIAGNOSIS — N202 Calculus of kidney with calculus of ureter: Secondary | ICD-10-CM | POA: Diagnosis not present

## 2016-10-15 DIAGNOSIS — R109 Unspecified abdominal pain: Secondary | ICD-10-CM | POA: Diagnosis not present

## 2016-10-15 DIAGNOSIS — Z87442 Personal history of urinary calculi: Secondary | ICD-10-CM | POA: Diagnosis not present

## 2016-10-15 DIAGNOSIS — N200 Calculus of kidney: Secondary | ICD-10-CM | POA: Diagnosis not present

## 2016-10-16 DIAGNOSIS — N200 Calculus of kidney: Secondary | ICD-10-CM | POA: Diagnosis not present

## 2016-10-16 DIAGNOSIS — N201 Calculus of ureter: Secondary | ICD-10-CM | POA: Diagnosis not present

## 2016-10-16 DIAGNOSIS — N202 Calculus of kidney with calculus of ureter: Secondary | ICD-10-CM | POA: Diagnosis not present

## 2016-10-16 DIAGNOSIS — F17200 Nicotine dependence, unspecified, uncomplicated: Secondary | ICD-10-CM | POA: Diagnosis not present

## 2016-10-16 DIAGNOSIS — R935 Abnormal findings on diagnostic imaging of other abdominal regions, including retroperitoneum: Secondary | ICD-10-CM | POA: Diagnosis not present

## 2016-10-16 DIAGNOSIS — R1031 Right lower quadrant pain: Secondary | ICD-10-CM | POA: Diagnosis not present

## 2016-10-16 DIAGNOSIS — R109 Unspecified abdominal pain: Secondary | ICD-10-CM | POA: Diagnosis not present

## 2016-10-19 DIAGNOSIS — F17200 Nicotine dependence, unspecified, uncomplicated: Secondary | ICD-10-CM | POA: Diagnosis not present

## 2016-10-19 DIAGNOSIS — R319 Hematuria, unspecified: Secondary | ICD-10-CM | POA: Diagnosis not present

## 2016-10-19 DIAGNOSIS — R935 Abnormal findings on diagnostic imaging of other abdominal regions, including retroperitoneum: Secondary | ICD-10-CM | POA: Diagnosis not present

## 2016-10-19 DIAGNOSIS — Z960 Presence of urogenital implants: Secondary | ICD-10-CM | POA: Diagnosis not present

## 2016-10-25 DIAGNOSIS — F17200 Nicotine dependence, unspecified, uncomplicated: Secondary | ICD-10-CM | POA: Diagnosis not present

## 2016-10-25 DIAGNOSIS — Z79899 Other long term (current) drug therapy: Secondary | ICD-10-CM | POA: Diagnosis not present

## 2016-10-25 DIAGNOSIS — Z87442 Personal history of urinary calculi: Secondary | ICD-10-CM | POA: Diagnosis not present

## 2016-10-25 DIAGNOSIS — N200 Calculus of kidney: Secondary | ICD-10-CM | POA: Diagnosis not present

## 2016-10-25 DIAGNOSIS — Z466 Encounter for fitting and adjustment of urinary device: Secondary | ICD-10-CM | POA: Diagnosis not present

## 2016-10-25 DIAGNOSIS — N201 Calculus of ureter: Secondary | ICD-10-CM | POA: Diagnosis not present

## 2016-11-02 DIAGNOSIS — N201 Calculus of ureter: Secondary | ICD-10-CM | POA: Diagnosis not present

## 2016-11-02 DIAGNOSIS — Z466 Encounter for fitting and adjustment of urinary device: Secondary | ICD-10-CM | POA: Diagnosis not present

## 2016-11-02 DIAGNOSIS — Z87442 Personal history of urinary calculi: Secondary | ICD-10-CM | POA: Diagnosis not present

## 2016-11-04 DIAGNOSIS — N200 Calculus of kidney: Secondary | ICD-10-CM | POA: Insufficient documentation

## 2016-11-05 ENCOUNTER — Other Ambulatory Visit: Payer: Self-pay | Admitting: Family Medicine

## 2016-11-06 ENCOUNTER — Other Ambulatory Visit: Payer: Self-pay | Admitting: Family Medicine

## 2016-11-08 ENCOUNTER — Other Ambulatory Visit: Payer: Self-pay

## 2017-01-05 ENCOUNTER — Other Ambulatory Visit: Payer: Self-pay | Admitting: Family Medicine

## 2017-01-05 DIAGNOSIS — F3342 Major depressive disorder, recurrent, in full remission: Secondary | ICD-10-CM

## 2017-01-17 ENCOUNTER — Encounter: Payer: Self-pay | Admitting: *Deleted

## 2017-01-17 ENCOUNTER — Emergency Department (INDEPENDENT_AMBULATORY_CARE_PROVIDER_SITE_OTHER)
Admission: EM | Admit: 2017-01-17 | Discharge: 2017-01-17 | Disposition: A | Discharge status: Home / Self Care | Payer: BLUE CROSS/BLUE SHIELD | Source: Home / Self Care | Attending: Family Medicine | Admitting: Family Medicine

## 2017-01-17 DIAGNOSIS — S61011A Laceration without foreign body of right thumb without damage to nail, initial encounter: Secondary | ICD-10-CM

## 2017-01-17 HISTORY — DX: Calculus of kidney: N20.0

## 2017-01-17 NOTE — ED Triage Notes (Signed)
Pt c/o RT thumb laceration x 1 hour ago; he was loading a light fixture onto his truck and was cut by the metal on the fixture. Reports Tdap last year.

## 2017-01-17 NOTE — Discharge Instructions (Addendum)
Change dressing daily and apply Bacitracin ointment to wound.  Keep wound clean and dry.  Return for any signs of infection (or follow-up with family doctor):  Increasing redness, swelling, pain, heat, drainage, etc. °Return in 10 days for suture removal.   °

## 2017-01-17 NOTE — ED Provider Notes (Signed)
[facility]    CSN: [id number] Arrival date & time: 01/17/17  1157     History   Chief Complaint Chief Complaint  Patient presents with  . Extremity Laceration    HPI Adam RueGregory Norton is a 41 y.o. male.   One hour ago while loading a light fixture onto his truck, patient lacerated his right thumb by a sharp edge on the light fixture.  His last Tdap was 08/18/15.   The history is provided by the patient.  Laceration  Length:  1cm Depth:  Through dermis Quality: straight   Bleeding: controlled   Time since incident:  1 hour Laceration mechanism:  Metal edge Pain details:    Quality:  Aching   Severity:  Mild   Timing:  Constant   Progression:  Improving Foreign body present:  No foreign bodies Worsened by:  Movement Ineffective treatments:  None tried Associated symptoms: no focal weakness, no numbness and no swelling     Past Medical History:  Diagnosis Date  . Kidney stone     Patient Active Problem List   Diagnosis Date Noted  . Vitamin D deficiency 08/23/2016  . Left cervical radiculopathy 04/14/2016  . Right cervical radiculopathy 02/25/2016  . Left carpal tunnel syndrome 02/10/2016  . Arthropathy of cervical facet joint (HCC) 01/13/2016  . Left shoulder pain 01/05/2016  . Insomnia 08/25/2015  . Generalized anxiety disorder 08/18/2015  . Depression 08/18/2015    Past Surgical History:  Procedure Laterality Date  . KIDNEY STONE SURGERY         Home Medications    Prior to Admission medications   Medication Sig Start Date End Date Taking? Authorizing Provider  loratadine (CLARITIN) 10 MG tablet Take 10 mg by mouth daily.    [provider]  sertraline (ZOLOFT) 100 MG tablet TAKE 1 TABLET BY MOUTH EVERY DAY 01/05/17   Rodolph Bongorey, Evan S, MD  traZODone (DESYREL) 100 MG tablet TAKE 1 TABLET(100 MG) BY MOUTH AT BEDTIME AS NEEDED FOR SLEEP 11/08/16   Rodolph Bongorey, Evan S, MD    Family History Family History  Problem Relation Age of Onset  .  Alcohol abuse Father   . Diabetes Father   . Alcohol abuse Brother     Social History Social History  Substance Use Topics  . Smoking status: Current Some Day Smoker  . Smokeless tobacco: Never Used  . Alcohol use 0.0 oz/week     Allergies   Patient has no known allergies.   Review of Systems Review of Systems  Neurological: Negative for focal weakness.  All other systems reviewed and are negative.    Physical Exam Triage Vital Signs ED Triage Vitals  Enc Vitals Group     BP 01/17/17 1220 111/67     Pulse Rate 01/17/17 1220 89     Resp 01/17/17 1220 16     Temp 01/17/17 1220 98.1 F (36.7 C)     Temp Source 01/17/17 1220 Oral     SpO2 01/17/17 1220 96 %     Weight 01/17/17 1221 168 lb (76.2 kg)     Height 01/17/17 1221 5\' 8"  (1.727 m)     Head Circumference --      Peak Flow --      Pain Score 01/17/17 1221 4     Pain Loc --      Pain Edu? --      Excl. in GC? --    No data found.   Updated Vital Signs BP 111/67 (  BP Location: Left Arm)   Pulse 89   Temp 98.1 F (36.7 C) (Oral)   Resp 16   Ht 5\' 8"  (1.727 m)   Wt 168 lb (76.2 kg)   SpO2 96%   BMI 25.54 kg/m   Visual Acuity Right Eye Distance:   Left Eye Distance:   Bilateral Distance:    Right Eye Near:   Left Eye Near:    Bilateral Near:     Physical Exam  Constitutional: He appears well-developed and well-nourished. No distress.  HENT:  Head: Atraumatic.  Eyes: Pupils are equal, round, and reactive to light.  Cardiovascular: Normal rate.   Pulmonary/Chest: Effort normal.  Musculoskeletal: Normal range of motion.       Right hand: He exhibits laceration. He exhibits no deformity. Normal sensation noted.       Hands: Right thumb has simple 1cm long flap laceration over IP joint as noted on diagram.  Thumb has full range of motion.  Distal neurovascular function is intact.   Neurological: He is alert.  Skin: Skin is warm and dry.  Nursing note and vitals reviewed.    UC Treatments /  Results  Labs (all labs ordered are listed, but only abnormal results are displayed) Labs Reviewed - No data to display  EKG  EKG Interpretation None       Radiology No results found.  Procedures Procedures  Laceration Repair Discussed benefits and risks of procedure and verbal consent obtained. Using sterile technique and digital anesthesia with 2% lidocaine without epinephrine, cleansed wound with Betadine followed by copious lavage with normal saline.  Wound carefully inspected for debris and foreign bodies; none found.  Wound closed with #3, 5-0 interrupted nylon sutures.  Bacitracin and non-stick sterile dressing applied.  Wound precautions explained to patient.  Return for suture removal in 10 days.   Medications Ordered in UC Medications - No data to display   Initial Impression / Assessment and Plan / UC Course  I have reviewed the triage vital signs and the nursing notes.  Pertinent labs & imaging results that were available during my care of the patient were reviewed by me and considered in my medical decision making (see chart for details).    Change dressing daily and apply Bacitracin ointment to wound.  Keep wound clean and dry.  Return for any signs of infection (or follow-up with family doctor):  Increasing redness, swelling, pain, heat, drainage, etc. Return in 10 days for suture removal.      Final Clinical Impressions(s) / UC Diagnoses   Final diagnoses:  Laceration of right thumb without foreign body without damage to nail, initial encounter    New Prescriptions New Prescriptions   No medications on file     Lattie Haw, MD 01/26/17 [phone]

## 2017-02-11 ENCOUNTER — Telehealth: Payer: Self-pay | Admitting: Family Medicine

## 2017-02-11 DIAGNOSIS — N201 Calculus of ureter: Secondary | ICD-10-CM | POA: Diagnosis not present

## 2017-02-11 DIAGNOSIS — Z87442 Personal history of urinary calculi: Secondary | ICD-10-CM | POA: Diagnosis not present

## 2017-02-11 NOTE — Telephone Encounter (Signed)
LVM requesting pt to call the office.  

## 2017-02-11 NOTE — Telephone Encounter (Signed)
I received FMLA paperwork.  Please have patient schedule a follow up appointment to fill this out together to get it done correctly.

## 2017-02-11 NOTE — Telephone Encounter (Signed)
Pt informed. Pt scheduled an appt. Pt expressed understanding and is agreeable. Liborio NixonAmanda Kamilo Och CMA, RT

## 2017-02-22 ENCOUNTER — Encounter: Payer: Self-pay | Admitting: Family Medicine

## 2017-02-22 ENCOUNTER — Ambulatory Visit (INDEPENDENT_AMBULATORY_CARE_PROVIDER_SITE_OTHER): Payer: BLUE CROSS/BLUE SHIELD | Admitting: Family Medicine

## 2017-02-22 VITALS — BP 105/66 | HR 76 | Ht 66.0 in | Wt 166.0 lb

## 2017-02-22 DIAGNOSIS — M4692 Unspecified inflammatory spondylopathy, cervical region: Secondary | ICD-10-CM

## 2017-02-22 DIAGNOSIS — G4700 Insomnia, unspecified: Secondary | ICD-10-CM

## 2017-02-22 DIAGNOSIS — F3342 Major depressive disorder, recurrent, in full remission: Secondary | ICD-10-CM | POA: Diagnosis not present

## 2017-02-22 DIAGNOSIS — M47812 Spondylosis without myelopathy or radiculopathy, cervical region: Secondary | ICD-10-CM

## 2017-02-22 MED ORDER — TRAZODONE HCL 100 MG PO TABS: ORAL_TABLET | ORAL | 3 refills | Status: DC

## 2017-02-22 MED ORDER — SERTRALINE HCL 100 MG PO TABS: 100.0000 mg | ORAL_TABLET | Freq: Every day | ORAL | 3 refills | Status: DC

## 2017-02-22 NOTE — Patient Instructions (Signed)
Thank you for coming in today. Continue current treatment.  We will likely have to renew FMLA paperwork every year.  We can make that visit a check up as well.

## 2017-02-22 NOTE — Progress Notes (Signed)
Adam Norton is a 41 y.o. male who presents to [name] today for follow-up neck pain. Patient has a history of neck pain with some radicular symptoms. This is been ongoing for over a year. He misses work occasionally because of pain but overall is completing his job duties. He needs to renew his FMLA paperwork. Symptoms are moderate.  Additionally patient notes that he is doing well from a depression and insomnia standpoint. He continues Zoloft and trazodone.   Past Medical History:  Diagnosis Date  . Kidney stone    Past Surgical History:  Procedure Laterality Date  . KIDNEY STONE SURGERY     Social History  Substance Use Topics  . Smoking status: Current Some Day Smoker  . Smokeless tobacco: Never Used  . Alcohol use 0.0 oz/week   family history includes Alcohol abuse in his brother and father; Diabetes in his father.  ROS as above:  Medications: Current Outpatient Prescriptions  Medication Sig Dispense Refill  . loratadine (CLARITIN) 10 MG tablet Take 10 mg by mouth daily.    . sertraline (ZOLOFT) 100 MG tablet Take 1 tablet (100 mg total) by mouth daily. 90 tablet 3  . traZODone (DESYREL) 100 MG tablet TAKE 1 TABLET(100 MG) BY MOUTH AT BEDTIME AS NEEDED FOR SLEEP 90 tablet 3   No current facility-administered medications for this visit.    No Known Allergies  Health Maintenance Health Maintenance  Topic Date Due  . HIV Screening  08/28/1988  . INFLUENZA VACCINE  02/16/2017  . TETANUS/TDAP  08/17/2025     Exam:  BP 105/66   Pulse 76   Ht 5\' 6"  (1.676 m)   Wt 166 lb (75.3 kg)   BMI 26.79 kg/m  Gen: Well NAD HEENT: EOMI,  MMM Lungs: Normal work of breathing. CTABL Heart: RRR no MRG Abd: NABS, Soft. Nondistended, Nontender Exts: Brisk capillary refill, warm and well perfused.  Cervical spine: Nontender to midline. Tender palpation  bilateral cervical paraspinal muscles. Normal motion. Temperature me strength is intact. Psych: Alert and oriented normal speech thought process and affect. No SI or HI expressed.  Depression screen [facility] 2/9 02/22/2017 08/23/2016  Decreased Interest 1 0  Down, Depressed, Hopeless 1 0  PHQ - 2 Score 2 0  Altered sleeping 1 3  Tired, decreased energy 2 1  Change in appetite 0 0  Feeling bad or failure about yourself  0 0  Trouble concentrating 1 1  Moving slowly or fidgety/restless 0 0  Suicidal thoughts 0 0  PHQ-9 Score 6 5      No results found for this or any previous visit (from the past 72 hour(s)). No results found.    Assessment and Plan: 41 y.o. male with  Neck pain: Doing reasonably well. Continue conservative management. FMLA paperwork renewed. Scan today.  Depression and insomnia doing well continue current regimen recheck yearly.   No orders of the defined types were placed in this encounter.  Meds ordered this encounter  Medications  . traZODone (DESYREL) 100 MG tablet    Sig: TAKE 1 TABLET(100 MG) BY MOUTH AT BEDTIME AS NEEDED FOR SLEEP    Dispense:  90 tablet    Refill:  3  . sertraline (ZOLOFT) 100 MG tablet    Sig: Take 1 tablet (100 mg total) by mouth daily.    Dispense:  90 tablet    Refill:  3     Discussed warning signs or  symptoms. Please see discharge instructions. Patient expresses understanding.

## 2017-04-12 ENCOUNTER — Ambulatory Visit (INDEPENDENT_AMBULATORY_CARE_PROVIDER_SITE_OTHER): Payer: BLUE CROSS/BLUE SHIELD | Admitting: Physician Assistant

## 2017-04-12 ENCOUNTER — Ambulatory Visit (INDEPENDENT_AMBULATORY_CARE_PROVIDER_SITE_OTHER): Payer: BLUE CROSS/BLUE SHIELD

## 2017-04-12 ENCOUNTER — Encounter: Payer: Self-pay | Admitting: Physician Assistant

## 2017-04-12 VITALS — BP 118/73 | HR 81 | Temp 98.2°F | Wt 159.0 lb

## 2017-04-12 DIAGNOSIS — J209 Acute bronchitis, unspecified: Secondary | ICD-10-CM | POA: Diagnosis not present

## 2017-04-12 DIAGNOSIS — R05 Cough: Secondary | ICD-10-CM

## 2017-04-12 DIAGNOSIS — R0989 Other specified symptoms and signs involving the circulatory and respiratory systems: Secondary | ICD-10-CM

## 2017-04-12 DIAGNOSIS — F17200 Nicotine dependence, unspecified, uncomplicated: Secondary | ICD-10-CM | POA: Diagnosis not present

## 2017-04-12 MED ORDER — PREDNISONE 50 MG PO TABS: ORAL_TABLET | ORAL | 0 refills | Status: DC

## 2017-04-12 MED ORDER — AZITHROMYCIN 250 MG PO TABS
ORAL_TABLET | ORAL | 0 refills | Status: DC
Start: 2017-04-12 — End: 2017-04-19

## 2017-04-12 MED ORDER — ALBUTEROL SULFATE HFA 108 (90 BASE) MCG/ACT IN AERS: 1.0000 | INHALATION_SPRAY | RESPIRATORY_TRACT | 0 refills | Status: DC | PRN

## 2017-04-12 MED ORDER — IPRATROPIUM-ALBUTEROL 0.5-2.5 (3) MG/3ML IN SOLN
3.0000 mL | Freq: Once | RESPIRATORY_TRACT | Status: AC
  Administered 2017-04-12: 3 mL via RESPIRATORY_TRACT

## 2017-04-12 NOTE — Progress Notes (Signed)
HPI:                                                                Adam Norton is a 41 y.o. male who presents to [name]: [facility] today for  cough  Cough  This is a new problem. The current episode started in the past 7 days. The problem has been unchanged. The problem occurs every few minutes. The cough is productive of purulent sputum. Associated symptoms include ear congestion, nasal congestion, a sore throat, shortness of breath and wheezing. Pertinent negatives include no chest pain, fever or hemoptysis. Nothing aggravates the symptoms. Risk factors for lung disease include smoking/tobacco exposure. He has tried nothing for the symptoms. His past medical history is significant for environmental allergies.    Past Medical History:  Diagnosis Date  . Kidney stone    Past Surgical History:  Procedure Laterality Date  . KIDNEY STONE SURGERY     Social History  Substance Use Topics  . Smoking status: Current Some Day Smoker  . Smokeless tobacco: Never Used  . Alcohol use 0.0 oz/week   family history includes Alcohol abuse in his brother and father; Diabetes in his father.  ROS: negative except as noted in the HPI  Medications: Current Outpatient Prescriptions  Medication Sig Dispense Refill  . loratadine (CLARITIN) 10 MG tablet Take 10 mg by mouth daily.    . sertraline (ZOLOFT) 100 MG tablet Take 1 tablet (100 mg total) by mouth daily. 90 tablet 3  . traZODone (DESYREL) 100 MG tablet TAKE 1 TABLET(100 MG) BY MOUTH AT BEDTIME AS NEEDED FOR SLEEP 90 tablet 3   No current facility-administered medications for this visit.    No Known Allergies     Objective:  BP 118/73   Pulse 81   Temp 98.2 F (36.8 C) (Oral)   Wt 159 lb (72.1 kg)   SpO2 95%   BMI 25.66 kg/m  Gen: well-groomed, cooperative, not ill-appearing, no distress HEENT: normal conjunctiva, TM's clear bilaterally, nasal mucosa edematous, oropharynx with erythema,  uvula midline, no exudates, moist mucus membranes, no frontal or maxillary sinus tenderness, neck supple, trachea midline Pulm: Normal work of breathing, normal phonation, diffuse inspiratory wheeze and expiratory rhonchi CV: Normal rate, regular rhythm, s1 and s2 distinct, no murmurs, clicks or rubs  Neuro: alert and oriented x 3, no tremor MSK: extremities atraumatic, normal gait and station Lymph: no cervical or tonsillar adenopathy Skin: warm, dry, intact; no rashes on exposed skin, no cyanosis  No results found for this or any previous visit (from the past 72 hour(s)). No results found.   Assessment and Plan: 41 y.o. male with   1. Bronchitis, acute, with bronchospasm - suspect underlying COPD given history of heavy tobacco use. Will treat as COPD exacerbation with steroid burst, azithromycin, and albuterol prn - predniSONE (DELTASONE) 50 MG tablet; One tab PO daily for 5 days.  Dispense: 5 tablet; Refill: 0 - azithromycin (ZITHROMAX Z-PAK) 250 MG tablet; Take 2 tablets (500 mg) on  Day 1,  followed by 1 tablet (250 mg) once daily on Days 2 through 5.  Dispense: 6 tablet; Refill: 0 - albuterol (VENTOLIN HFA) 108 (90 Base) MCG/ACT inhaler; Inhale 1-2 puffs into the lungs every 4 (four)  hours as needed for wheezing or shortness of breath.  Dispense: 1 Inhaler; Refill: 0 - DG Chest 2 View  2. Rhonchi - duoneb given in office today. Rhonchi resolved. SpO2 95% on RA at rest - albuterol (VENTOLIN HFA) 108 (90 Base) MCG/ACT inhaler; Inhale 1-2 puffs into the lungs every 4 (four) hours as needed for wheezing or shortness of breath.  Dispense: 1 Inhaler; Refill: 0 - DG Chest 2 View  3. Tobacco use disorder - smokes 1 ppd - patient may benefit from influenza and pneumonia vaccination - recommend follow-up with PCP to discuss cessation and preventive measures  Patient education and anticipatory guidance given Patient agrees with treatment plan Follow-up with PCP in 1 week as needed if  symptoms worsen or fail to improve  Levonne Hubert PA-C

## 2017-04-12 NOTE — Patient Instructions (Addendum)
- Downstairs for chest x-ray - Antibiotic and steroid x 5 days as prescribed - Use Albuterol inhaler 1-2 puffs every 4 hours as needed for wheezing/shortness of breath - Follow-up with PCP in 1 week   Acute Bronchitis, Adult Acute bronchitis is sudden (acute) swelling of the air tubes (bronchi) in the lungs. Acute bronchitis causes these tubes to fill with mucus, which can make it hard to breathe. It can also cause coughing or wheezing. In adults, acute bronchitis usually goes away within 2 weeks. A cough caused by bronchitis may last up to 3 weeks. Smoking, allergies, and asthma can make the condition worse. Repeated episodes of bronchitis may cause further lung problems, such as chronic obstructive pulmonary disease (COPD). What are the causes? This condition can be caused by germs and by substances that irritate the lungs, including:  Cold and flu viruses. This condition is most often caused by the same virus that causes a cold.  Bacteria.  Exposure to tobacco smoke, dust, fumes, and air pollution.  What increases the risk? This condition is more likely to develop in people who:  Have close contact with someone with acute bronchitis.  Are exposed to lung irritants, such as tobacco smoke, dust, fumes, and vapors.  Have a weak immune system.  Have a respiratory condition such as asthma.  What are the signs or symptoms? Symptoms of this condition include:  A cough.  Coughing up clear, yellow, or green mucus.  Wheezing.  Chest congestion.  Shortness of breath.  A fever.  Body aches.  Chills.  A sore throat.  How is this diagnosed? This condition is usually diagnosed with a physical exam. During the exam, your health care provider may order tests, such as chest X-rays, to rule out other conditions. He or she may also:  Test a sample of your mucus for bacterial infection.  Check the level of oxygen in your blood. This is done to check for pneumonia.  Do a chest  X-ray or lung function testing to rule out pneumonia and other conditions.  Perform blood tests.  Your health care provider will also ask about your symptoms and medical history. How is this treated? Most cases of acute bronchitis clear up over time without treatment. Your health care provider may recommend:  Drinking more fluids. Drinking more makes your mucus thinner, which may make it easier to breathe.  Taking a medicine for a fever or cough.  Taking an antibiotic medicine.  Using an inhaler to help improve shortness of breath and to control a cough.  Using a cool mist vaporizer or humidifier to make it easier to breathe.  Follow these instructions at home: Medicines  Take over-the-counter and prescription medicines only as told by your health care provider.  If you were prescribed an antibiotic, take it as told by your health care provider. Do not stop taking the antibiotic even if you start to feel better. General instructions  Get plenty of rest.  Drink enough fluids to keep your urine clear or pale yellow.  Avoid smoking and secondhand smoke. Exposure to cigarette smoke or irritating chemicals will make bronchitis worse. If you smoke and you need help quitting, ask your health care provider. Quitting smoking will help your lungs heal faster.  Use an inhaler, cool mist vaporizer, or humidifier as told by your health care provider.  Keep all follow-up visits as told by your health care provider. This is important. How is this prevented? To lower your risk of getting this condition  again:  Wash your hands often with soap and water. If soap and water are not available, use hand sanitizer.  Avoid contact with people who have cold symptoms.  Try not to touch your hands to your mouth, nose, or eyes.  Make sure to get the flu shot every year.  Contact a health care provider if:  Your symptoms do not improve in 2 weeks of treatment. Get help right away if:  You cough  up blood.  You have chest pain.  You have severe shortness of breath.  You become dehydrated.  You faint or keep feeling like you are going to faint.  You keep vomiting.  You have a severe headache.  Your fever or chills gets worse. This information is not intended to replace advice given to you by your health care provider. Make sure you discuss any questions you have with your health care provider. Document Released: 08/12/2004 Document Revised: 01/28/2016 Document Reviewed: 12/24/2015 Elsevier Interactive Patient Education  2017 [employer].

## 2017-04-12 NOTE — Progress Notes (Signed)
Chest x-ray normal  Treatment plan doesn't change Keep follow-up appointment with Dr. Denyse Amass

## 2017-04-19 ENCOUNTER — Encounter: Payer: Self-pay | Admitting: Family Medicine

## 2017-04-19 ENCOUNTER — Ambulatory Visit (INDEPENDENT_AMBULATORY_CARE_PROVIDER_SITE_OTHER): Payer: BLUE CROSS/BLUE SHIELD | Admitting: Family Medicine

## 2017-04-19 VITALS — BP 112/72 | HR 69 | Temp 98.4°F | Wt 159.0 lb

## 2017-04-19 DIAGNOSIS — J40 Bronchitis, not specified as acute or chronic: Secondary | ICD-10-CM | POA: Diagnosis not present

## 2017-04-19 DIAGNOSIS — F17200 Nicotine dependence, unspecified, uncomplicated: Secondary | ICD-10-CM

## 2017-04-19 NOTE — Patient Instructions (Addendum)
Thank you for coming in today. Schedule Spirometry for 1 month.  We will do the flu and pneumonia shots then.  Recheck sooner if needed.  Finish out treatment for bronchitis.  Use the albuterol as needed for wheezing.    Pulmonary Function Tests Pulmonary function tests (PFTs) are used to measure how well your lungs work, find out what is causing your lung problems, and figure out the best treatment for you. You may have PFTs:  When you have an illness involving the lungs.  To follow changes in your lung function over time if you have a chronic lung disease.  If you are an IT trainer. This checks the effects of being exposed to chemicals over a long period of time.  To check lung function before having surgery or other procedures.  To check your lungs if you smoke.  To check if prescribed medicines or treatments are helping your lungs.  Your results will be compared to the expected lung function of someone with healthy lungs who is similar to you in:  Age.  Gender.  Height.  Weight.  Race or ethnicity.  This is done to show how your lungs compare to normal lung function (percent predicted). This is how your health care provider knows if your lung function is normal or not. If you have had PFTs done before, your health care provider will compare your current results with past results. This shows if your lung function is better, worse, or the same as before. Tell a health care provider about:  Any allergies you have.  All medicines you are taking, including inhaler or nebulizer medicines, vitamins, herbs, eye drops, creams, and over-the-counter medicines.  Any blood disorders you have.  Any surgeries you have had, especially recent eye surgery, abdominal surgery, or chest surgery. These can make PFTs difficult or unsafe.  Any medical conditions you have, including chest pain or heart problems, tuberculosis, or respiratory infections such as pneumonia, a cold,  or the flu.  Any fear of being in closed spaces (claustrophobia). Some of your tests may be in a closed space. What are the risks? Generally, this is a safe procedure. However, problems may occur, including:  Light-headedness due to over-breathing (hyperventilation).  An asthma attack from deep breathing.  A collapsed lung.  What happens before the procedure?  Take over-the-counter and prescription medicines only as told by your health care provider. If you take inhaler or nebulizer medicines, ask your health care provider which medicines you should take on the day of your testing. Some inhaler medicines may interfere with PFTs if they are taken shortly before the tests.  Follow your health care provider's instructions on eating and drinking restrictions. This may include avoiding eating large meals and drinking alcohol before the testing.  Do not use any products that contain nicotine or tobacco, such as cigarettes and e-cigarettes. If you need help quitting, ask your health care provider.  Wear comfortable clothing that will not interfere with breathing. What happens during the procedure?  You will be given a soft nose clip to wear. This is done so all of your breaths will go through your mouth instead of your nose.  You will be given a germ-free (sterile) mouthpiece. It will be attached to a machine that measures your breathing (spirometer).  You will be asked to do various breathing maneuvers. The maneuvers will be done by breathing in (inhaling) and breathing out (exhaling). You may be asked to repeat the maneuvers several times before the  testing is done.  It is important to follow the instructions exactly to get accurate results. Make sure to blow as hard and as fast as you can when you are told to do so.  You may be given a medicine that makes the small air passages in your lungs larger (bronchodilator) after testing has been done. This medicine will make it easier for you to  breathe.  The tests will be repeated after the bronchodilator has taken effect.  You will be monitored carefully during the procedure for faintness, dizziness, trouble breathing, or any other problems. The procedure may vary among health care providers and hospitals. What happens after the procedure?  It is up to you to get your test results. Ask your health care provider, or the department that is doing the tests, when your results will be ready. After you have received your test results, talk with your health care provider about treatment options, if necessary. Summary  Pulmonary function tests (PFTs) are used to measure how well your lungs work, find out what is causing your lung problems, and figure out the best treatment for you.  Wear comfortable clothing that will not interfere with breathing.  It is up to you to get your test results. After you have received them, talk with your health care provider about treatment options, if necessary. This information is not intended to replace advice given to you by your health care provider. Make sure you discuss any questions you have with your health care provider. Document Released: 02/26/2004 Document Revised: 05/27/2016 Document Reviewed: 05/27/2016 Elsevier Interactive Patient Education  2017 [employer].

## 2017-04-19 NOTE — Progress Notes (Signed)
Adam Norton is a 41 y.o. male who presents to [name]: [facility] today for follow-up of bronchitis.  Patient presented on September 25 with symptoms of acute bronchitis. He has completed a course of prednisone, albuterol, and azithromycin. He feels he is recovering and his breathing is improved. He has had less cough and sputum production recently. He denies any fevers or shortness of breath. He endorses some sinus pressure, but notes that this is typical for him. Patient reports continuing to smoke 1 pack of cigarettes per day. He is not interested in quitting at this time.   Patient denies any chest pain, shortness of breath, weight loss, chills, changes in urination, or changes in bowel movements.   Past Medical History:  Diagnosis Date  . Kidney stone    Past Surgical History:  Procedure Laterality Date  . KIDNEY STONE SURGERY     Social History  Substance Use Topics  . Smoking status: Current Every Day Smoker    Packs/day: 1.00  . Smokeless tobacco: Never Used  . Alcohol use 0.0 oz/week   family history includes Alcohol abuse in his brother and father; Diabetes in his father.  ROS as above:  Medications: Current Outpatient Prescriptions  Medication Sig Dispense Refill  . albuterol (VENTOLIN HFA) 108 (90 Base) MCG/ACT inhaler Inhale 1-2 puffs into the lungs every 4 (four) hours as needed for wheezing or shortness of breath. 1 Inhaler 0  . loratadine (CLARITIN) 10 MG tablet Take 10 mg by mouth daily.    . sertraline (ZOLOFT) 100 MG tablet Take 1 tablet (100 mg total) by mouth daily. 90 tablet 3  . traZODone (DESYREL) 100 MG tablet TAKE 1 TABLET(100 MG) BY MOUTH AT BEDTIME AS NEEDED FOR SLEEP 90 tablet 3   No current facility-administered medications for this visit.    No Known Allergies  Health Maintenance Health Maintenance  Topic Date Due  . HIV  Screening  08/28/1988  . INFLUENZA VACCINE  02/16/2017  . TETANUS/TDAP  08/17/2025     Exam:  BP 112/72   Pulse 69   Temp 98.4 F (36.9 C) (Oral)   Wt 159 lb (72.1 kg)   SpO2 96%   BMI 25.66 kg/m  Gen: Well NAD, sitting comfortably in chair HEENT: EOMI,  MMM Lungs: Normal work of breathing, mild wheezing present, no rales or rhonchi Heart: RRR, normal S1 and S2, no MRG Abd: NABS, Soft. Nondistended, Nontender Exts: Brisk capillary refill, warm and well perfused.    No results found for this or any previous visit (from the past 72 hour(s)). No results found.    Assessment and Plan: 41 y.o. male presenting for follow-up of acute bronchitis. He appears to be recovering well. He was advised to continue using albuterol inhaler as needed for wheezing. Patient will plan to return in 1 month when he has fully recovered to receive influenza and pneumonia vaccines. He will also undergo lung function testing with spirometry at this time to test for any underlying lung disease. Patient was advised to return sooner if symptoms worsen or do not continue to gradually improve.   No orders of the defined types were placed in this encounter.  No orders of the defined types were placed in this encounter.    Discussed warning signs or symptoms. Please see discharge instructions. Patient expresses understanding.  I spent 25 minutes with this patient, greater than 50% was face-to-face time counseling regarding ddx and treatments plan. Marland Kitchen

## 2017-05-20 ENCOUNTER — Ambulatory Visit (INDEPENDENT_AMBULATORY_CARE_PROVIDER_SITE_OTHER): Payer: BLUE CROSS/BLUE SHIELD | Admitting: Family Medicine

## 2017-05-20 VITALS — BP 106/63 | HR 67 | Temp 97.8°F | Ht 68.0 in | Wt 159.0 lb

## 2017-05-20 DIAGNOSIS — R0602 Shortness of breath: Secondary | ICD-10-CM

## 2017-05-20 DIAGNOSIS — K089 Disorder of teeth and supporting structures, unspecified: Secondary | ICD-10-CM | POA: Diagnosis not present

## 2017-05-20 DIAGNOSIS — Z23 Encounter for immunization: Secondary | ICD-10-CM

## 2017-05-20 MED ORDER — ALBUTEROL SULFATE (2.5 MG/3ML) 0.083% IN NEBU
2.5000 mg | INHALATION_SOLUTION | Freq: Once | RESPIRATORY_TRACT | Status: AC
  Administered 2017-05-20: 2.5 mg via RESPIRATORY_TRACT

## 2017-05-20 MED ORDER — AMOXICILLIN 500 MG PO CAPS: 500.0000 mg | ORAL_CAPSULE | Freq: Three times a day (TID) | ORAL | 3 refills | Status: DC

## 2017-05-20 NOTE — Progress Notes (Signed)
       Adam Norton is a 41 y.o. male who [name] today for follow up spirometry. Adam Norton smokes and has repeated episodes of bronchitis.  He is here today for spirometry to evaluate for COPD.  The is feeling well and denies any current shortness of breath no fevers or chills chest pain palpitations.  Additionally Adam Norton notes poor dentition and dental pain.  He is planning on having his teeth removed this upcoming year and have dentures.  He thinks he could use a course of antibiotics.   Past Medical History:  Diagnosis Date  . Kidney stone    Past Surgical History:  Procedure Laterality Date  . KIDNEY STONE SURGERY     Social History  Substance Use Topics  . Smoking status: Current Every Day Smoker    Packs/day: 1.00  . Smokeless tobacco: Never Used  . Alcohol use 0.0 oz/week   family history includes Alcohol abuse in his brother and father; Diabetes in his father.  ROS as above:  Medications: Current Outpatient Prescriptions  Medication Sig Dispense Refill  . albuterol (VENTOLIN HFA) 108 (90 Base) MCG/ACT inhaler Inhale 1-2 puffs into the lungs every 4 (four) hours as needed for wheezing or shortness of breath. 1 Inhaler 0  . loratadine (CLARITIN) 10 MG tablet Take 10 mg by mouth daily.    . sertraline (ZOLOFT) 100 MG tablet Take 1 tablet (100 mg total) by mouth daily. 90 tablet 3  . traZODone (DESYREL) 100 MG tablet TAKE 1 TABLET(100 MG) BY MOUTH AT BEDTIME AS NEEDED FOR SLEEP 90 tablet 3   No current facility-administered medications for this visit.    No Known Allergies  Health Maintenance Health Maintenance  Topic Date Due  . HIV Screening  08/28/1988  . TETANUS/TDAP  08/17/2025  . INFLUENZA VACCINE  Completed     Exam:  BP 106/63   Pulse 67   Temp 97.8 F (36.6 C) (Oral)   Ht 5\' 8"  (1.727 m)   Wt 159 lb (72.1 kg)   SpO2 98%    BMI 24.18 kg/m  Gen: Well NAD HEENT: EOMI,  MMM poor dentition with erythema of the right upper incisor.  No visible abscess. Lungs: Normal work of breathing. CTABL Heart: RRR no MRG Abd: NABS, Soft. Nondistended, Nontender Exts: Brisk capillary refill, warm and well perfused.   Spirometry results: See scanned document.  All within 80% of normal with no significant change after albuterol   No results found for this or any previous visit (from the past 72 hour(s)). No results found.    Assessment and Plan: 41 y.o. male with  Breathing: Not diagnostic for COPD.  Plan for watchful waiting recommend smoking cessation and continued use of albuterol as needed.  Recheck in 6 months.  Poor dentition: With possible dental infection today.  Provided amoxicillin course and will recheck as needed.   Orders Placed This Encounter  Procedures  . Flu Vaccine QUAD 6+ mos PF IM (Fluarix Quad PF)  . Spirometry: Pre & Post Eval   Meds ordered this encounter  Medications  . albuterol (PROVENTIL) (2.5 MG/3ML) 0.083% nebulizer solution 2.5 mg     Discussed warning signs or symptoms. Please see discharge instructions. Patient expresses understanding.  I spent 25 minutes with this patient, greater than 50% was face-to-face time counseling regarding ddx and treatment plan.

## 2017-05-20 NOTE — Patient Instructions (Signed)
Thank you for coming in today. Use the amoxicillin if you have worsening dental infection pain.  Use albuterol as needed for wheezing.  Work on cutting back or quitting smoking.  Call or go to the emergency room if you get worse, have trouble breathing, have chest pains, or palpitations.   Recheck in 6 months. Or sooner if needed.

## 2017-06-29 ENCOUNTER — Other Ambulatory Visit: Payer: Self-pay | Admitting: Family Medicine

## 2017-06-29 DIAGNOSIS — F3342 Major depressive disorder, recurrent, in full remission: Secondary | ICD-10-CM

## 2017-07-03 DIAGNOSIS — R450 Nervousness: Secondary | ICD-10-CM | POA: Diagnosis not present

## 2017-07-03 DIAGNOSIS — F1210 Cannabis abuse, uncomplicated: Secondary | ICD-10-CM | POA: Insufficient documentation

## 2017-07-03 DIAGNOSIS — F411 Generalized anxiety disorder: Secondary | ICD-10-CM | POA: Diagnosis not present

## 2017-07-03 DIAGNOSIS — F1994 Other psychoactive substance use, unspecified with psychoactive substance-induced mood disorder: Secondary | ICD-10-CM | POA: Diagnosis not present

## 2017-07-04 ENCOUNTER — Other Ambulatory Visit: Payer: Self-pay | Admitting: Family Medicine

## 2017-07-05 ENCOUNTER — Telehealth: Payer: Self-pay | Admitting: Family Medicine

## 2017-07-05 NOTE — Telephone Encounter (Signed)
Pt called. He is needing refill on his trazodone, he called his pharmacy already.

## 2017-07-05 NOTE — Telephone Encounter (Signed)
Patient requested a refill for Trazodone 100 mg a 90 day supply with 3 refills was sent to pharmacy on 02/22/2017. Left message on patient vm advising of the refills and to call back if he has further questions. Rhonda Cunningham,CMA

## 2017-07-06 ENCOUNTER — Other Ambulatory Visit: Payer: Self-pay | Admitting: Family Medicine

## 2017-07-06 NOTE — Telephone Encounter (Signed)
[facility] K[facility] and this pt got this filled last on 05/31/17 for 30 day supply only. If ok please send in refill as pt needs it. KG LPN

## 2017-07-14 NOTE — Telephone Encounter (Signed)
Thank you :)

## 2017-08-05 ENCOUNTER — Other Ambulatory Visit: Payer: Self-pay | Admitting: Family Medicine

## 2017-08-05 DIAGNOSIS — F3342 Major depressive disorder, recurrent, in full remission: Secondary | ICD-10-CM

## 2017-11-15 DIAGNOSIS — N200 Calculus of kidney: Secondary | ICD-10-CM | POA: Diagnosis not present

## 2017-11-15 DIAGNOSIS — N201 Calculus of ureter: Secondary | ICD-10-CM | POA: Diagnosis not present

## 2017-11-17 ENCOUNTER — Ambulatory Visit: Payer: BLUE CROSS/BLUE SHIELD | Admitting: Family Medicine

## 2017-11-29 ENCOUNTER — Ambulatory Visit (INDEPENDENT_AMBULATORY_CARE_PROVIDER_SITE_OTHER): Payer: BLUE CROSS/BLUE SHIELD | Admitting: Family Medicine

## 2017-11-29 ENCOUNTER — Encounter: Payer: Self-pay | Admitting: Family Medicine

## 2017-11-29 VITALS — BP 108/68 | HR 71 | Wt 165.0 lb

## 2017-11-29 DIAGNOSIS — F411 Generalized anxiety disorder: Secondary | ICD-10-CM

## 2017-11-29 DIAGNOSIS — G4700 Insomnia, unspecified: Secondary | ICD-10-CM

## 2017-11-29 DIAGNOSIS — K089 Disorder of teeth and supporting structures, unspecified: Secondary | ICD-10-CM | POA: Diagnosis not present

## 2017-11-29 DIAGNOSIS — F3342 Major depressive disorder, recurrent, in full remission: Secondary | ICD-10-CM | POA: Diagnosis not present

## 2017-11-29 DIAGNOSIS — K429 Umbilical hernia without obstruction or gangrene: Secondary | ICD-10-CM

## 2017-11-29 DIAGNOSIS — F17200 Nicotine dependence, unspecified, uncomplicated: Secondary | ICD-10-CM | POA: Diagnosis not present

## 2017-11-29 MED ORDER — SERTRALINE HCL 100 MG PO TABS: 100.0000 mg | ORAL_TABLET | Freq: Every day | ORAL | 3 refills | Status: DC

## 2017-11-29 MED ORDER — TRAZODONE HCL 100 MG PO TABS: 100.0000 mg | ORAL_TABLET | Freq: Every day | ORAL | 3 refills | Status: DC

## 2017-11-29 NOTE — Progress Notes (Signed)
Adam Norton is a 42 y.o. male who presents to [facility]: [facility] today for abdominal mass.   Patient reports that he has felt a small bulge in his umbilicus. He denies pain, fever, diarrhea, or constipation. He states he does not know how long it has been there. He has not noticed it bulging outwards upon defecating.  He feels pretty well otherwise.   Mood: History of GAD and Major Depression. This has been well controlled with Zoloft. He feels well and is sleeping well. No SI/HI.   Smoking: Adam Norton is not ready to quit smoking at this time.  He smokes about a pack a day.  Past Medical History:  Diagnosis Date  . Kidney stone    Past Surgical History:  Procedure Laterality Date  . KIDNEY STONE SURGERY     Social History   Tobacco Use  . Smoking status: Current Every Day Smoker    Packs/day: 1.00  . Smokeless tobacco: Never Used  Substance Use Topics  . Alcohol use: Yes    Alcohol/week: 0.0 oz   family history includes Alcohol abuse in his brother and father; Cancer in his father; Diabetes in his father.  ROS as above:  Medications: Current Outpatient Medications  Medication Sig Dispense Refill  . albuterol (VENTOLIN HFA) 108 (90 Base) MCG/ACT inhaler Inhale 1-2 puffs into the lungs every 4 (four) hours as needed for wheezing or shortness of breath. 1 Inhaler 0  . loratadine (CLARITIN) 10 MG tablet Take 10 mg by mouth daily.    . sertraline (ZOLOFT) 100 MG tablet Take 1 tablet (100 mg total) by mouth daily. 90 tablet 3  . traZODone (DESYREL) 100 MG tablet Take 1 tablet (100 mg total) by mouth at bedtime. 90 tablet 3   No current facility-administered medications for this visit.    No Known Allergies  Health Maintenance Health Maintenance  Topic Date Due  . HIV Screening  08/28/1988  . INFLUENZA VACCINE  02/16/2018  . TETANUS/TDAP  08/17/2025      Exam:  BP 108/68   Pulse 71   Wt 165 lb (74.8 kg)   SpO2 96%   BMI 25.09 kg/m  Gen: Well NAD HEENT: EOMI,  MMM multiple missing teeth Lungs: Normal work of breathing. CTABL Heart: RRR no MRG Abd: NABS, Soft. Nondistended, Nontender. Small umbilical hernia that is reducible.  Exts: Brisk capillary refill, warm and well perfused.  Psych: Alert and oriented. Normal speech and thought process and affect. No SI/HI.   Depression screen [facility] 2/9 11/29/2017 02/22/2017 08/23/2016  Decreased Interest 1 1 0  Down, Depressed, Hopeless 1 1 0  PHQ - 2 Score 2 2 0  Altered sleeping 0 1 3  Tired, decreased energy Change in appetite 0 0 0  Feeling bad or failure about yourself  0 0 0  Trouble concentrating Moving slowly or fidgety/restless 0 0 0  Suicidal thoughts 0 0 0  PHQ-9 Score GAD 7 : Generalized Anxiety Score 11/29/2017  Nervous, Anxious, on Edge 1  Control/stop worrying 0  Worry too much - different things 0  Trouble relaxing 0  Restless 0  Easily annoyed or irritable 0  Afraid - awful might happen 0  Total GAD 7 Score 1  Anxiety Difficulty Not difficult at all    CT scan from outside hospital last year shows small umbilical hernia   No results  found for this or any previous visit (from the past 72 hour(s)). No results found.    Assessment and Plan: 42 y.o. male with an umbilical hernia.   Upon physical palpation, the mass on his abdomen is almost certainly a small umbilical hernia. Furthermore, a CT scan he had several months ago for a kidney stone showed this umbilical hernia despite him not being aware at the time. He is not experiencing any significant symptoms so I do not think we need to treat. We will watch for symptoms progression and he should return for further evaluation if things worsen.   Anxiety and depression: Doing well.  Refill Zoloft trazodone  Adam Norton is still smoking 1 pack per day and states he is not interested in quitting. I  advised him to cut down on the amount if he can and that I will be here if he needs help quitting.    No orders of the defined types were placed in this encounter.  Meds ordered this encounter  Medications  . sertraline (ZOLOFT) 100 MG tablet    Sig: Take 1 tablet (100 mg total) by mouth daily.    Dispense:  90 tablet    Refill:  3  . traZODone (DESYREL) 100 MG tablet    Sig: Take 1 tablet (100 mg total) by mouth at bedtime.    Dispense:  90 tablet    Refill:  3     Discussed warning signs or symptoms. Please see discharge instructions. Patient expresses understanding.

## 2017-11-29 NOTE — Patient Instructions (Signed)
Thank you for coming in today. You have an umbilical hernia.  If this worsens let me know we will refer to surgery.  Try to cut back.  The bare minimum. Is about 5 cigarettes a day.    Umbilical Hernia, Adult A hernia is a bulge of tissue that pushes through an opening between muscles. An umbilical hernia happens in the abdomen, near the belly button (umbilicus). The hernia may contain tissues from the small intestine, large intestine, or fatty tissue covering the intestines (omentum). Umbilical hernias in adults tend to get worse over time, and they require surgical treatment. There are several types of umbilical hernias. You may have:  A hernia located just above or below the umbilicus (indirect hernia). This is the most common type of umbilical hernia in adults.  A hernia that forms through an opening formed by the umbilicus (direct hernia).  A hernia that comes and goes (reducible hernia). A reducible hernia may be visible only when you strain, lift something heavy, or cough. This type of hernia can be pushed back into the abdomen (reduced).  A hernia that traps abdominal tissue inside the hernia (incarcerated hernia). This type of hernia cannot be reduced.  A hernia that cuts off blood flow to the tissues inside the hernia (strangulated hernia). The tissues can start to die if this happens. This type of hernia requires emergency treatment.  What are the causes? An umbilical hernia happens when tissue inside the abdomen presses on a weak area of the abdominal muscles. What increases the risk? You may have a greater risk of this condition if you:  Are obese.  Have had several pregnancies.  Have a buildup of fluid inside your abdomen (ascites).  Have had surgery that weakens the abdominal muscles.  What are the signs or symptoms? The main symptom of this condition is a painless bulge at or near the belly button. A reducible hernia may be visible only when you strain, lift  something heavy, or cough. Other symptoms may include:  Dull pain.  A feeling of pressure.  Symptoms of a strangulated hernia may include:  Pain that gets increasingly worse.  Nausea and vomiting.  Pain when pressing on the hernia.  Skin over the hernia becoming red or purple.  Constipation.  Blood in the stool.  How is this diagnosed? This condition may be diagnosed based on:  A physical exam. You may be asked to cough or strain while standing. These actions increase the pressure inside your abdomen and force the hernia through the opening in your muscles. Your health care provider may try to reduce the hernia by pressing on it.  Your symptoms and medical history.  How is this treated? Surgery is the only treatment for an umbilical hernia. Surgery for a strangulated hernia is done as soon as possible. If you have a small hernia that is not incarcerated, you may need to lose weight before having surgery. Follow these instructions at home:  Lose weight, if told by your health care provider.  Do not try to push the hernia back in.  Watch your hernia for any changes in color or size. Tell your health care provider if any changes occur.  You may need to avoid activities that increase pressure on your hernia.  Do not lift anything that is heavier than 10 lb (4.5 kg) until your health care provider says that this is safe.  Take over-the-counter and prescription medicines only as told by your health care provider.  Keep all  follow-up visits as told by your health care provider. This is important. Contact a health care provider if:  Your hernia gets larger.  Your hernia becomes painful. Get help right away if:  You develop sudden, severe pain near the area of your hernia.  You have pain as well as nausea or vomiting.  You have pain and the skin over your hernia changes color.  You develop a fever. This information is not intended to replace advice given to you by  your health care provider. Make sure you discuss any questions you have with your health care provider. Document Released: 12/05/2015 Document Revised: 03/07/2016 Document Reviewed: 12/05/2015 Elsevier Interactive Patient Education  [employer].

## 2017-12-08 ENCOUNTER — Ambulatory Visit (INDEPENDENT_AMBULATORY_CARE_PROVIDER_SITE_OTHER): Payer: BLUE CROSS/BLUE SHIELD | Admitting: Family Medicine

## 2017-12-08 VITALS — BP 121/77 | HR 79 | Ht 68.0 in | Wt 164.0 lb

## 2017-12-08 DIAGNOSIS — K429 Umbilical hernia without obstruction or gangrene: Secondary | ICD-10-CM

## 2017-12-08 DIAGNOSIS — K5901 Slow transit constipation: Secondary | ICD-10-CM | POA: Diagnosis not present

## 2017-12-08 MED ORDER — POLYETHYLENE GLYCOL 3350 17 GM/SCOOP PO POWD: 17.0000 g | Freq: Every day | ORAL | 12 refills | Status: DC

## 2017-12-08 NOTE — Progress Notes (Signed)
Adam Norton is a 42 y.o. male who presents to [name]: [facility] today for umbilical hernia and abdominal pain.  Patient was seen on 14 May and was found to have an umbilical hernia.  That time he was not very symptomatic and we discussed watchful waiting versus referral to general surgery.  In the interval he notes that he is having worsening pain around his umbilical hernia.  He notes this is associated with some mild right-sided lower he notes the pain is mild and intermittent.  He notes a little bulging bloating in this area.  He notes that he is often constipated.  He rates his stools on a 2-3 on Bristol stool scale rating scale.  He typically has a bowel movement every day.  He is not currently taking any medications for constipation.   ROS as above:  Exam:  BP 121/77   Pulse 79   Ht  (1.727 m)   Wt 164 lb (74.4 kg)   BMI 24.94 kg/m  Gen: Well NAD HEENT: EOMI,  MMM Lungs: Normal work of breathing. CTABL Heart: RRR no MRG Abd: NABS, Soft. Nondistended, small reducible umbilical hernia mildly tender.  Minimally tender right lower quadrant no masses rebound or guarding. Exts: Brisk capillary refill, warm and well perfused.   Lab and Radiology Results CT scan from outside hospital showing small fat-containing umbilical hernia from last year reviewed  Assessment and Plan: 42 y.o. male with  Umbilical hernia: Now becoming slightly more symptomatic.  I think is reasonable to refer to general surgery at this point for evaluation and potential surgery.  The patient describes constipation symptoms.  We discussed treatment options.  Plan for increased fiber diet, and trial of MiraLAX.  Recheck as needed.   Orders Placed This Encounter  Procedures  . Ambulatory referral to General Surgery    Referral Priority:   Routine    Referral Type:   Surgical    Referral  Reason:   Specialty Services Required    Requested Specialty:   General Surgery    Number of Visits Requested:   1   Meds ordered this encounter  Medications  . polyethylene glycol powder (GLYCOLAX/MIRALAX) powder    Sig: Take 17 g by mouth daily.    Dispense:  850 g    Refill:  12     Historical information moved to improve visibility of documentation.  Past Medical History:  Diagnosis Date  . Kidney stone    Past Surgical History:  Procedure Laterality Date  . KIDNEY STONE SURGERY     Social History   Tobacco Use  . Smoking status: Current Every Day Smoker    Packs/day: 1.00  . Smokeless tobacco: Never Used  Substance Use Topics  . Alcohol use: Yes    Alcohol/week: 0.0 oz   family history includes Alcohol abuse in his brother and father; Cancer in his father; Diabetes in his father.  Medications: Current Outpatient Medications  Medication Sig Dispense Refill  . albuterol (VENTOLIN HFA) 108 (90 Base) MCG/ACT inhaler Inhale 1-2 puffs into the lungs every 4 (four) hours as needed for wheezing or shortness of breath. 1 Inhaler 0  . loratadine (CLARITIN) 10 MG tablet Take 10 mg by mouth daily.    . sertraline (ZOLOFT) 100 MG tablet Take 1 tablet (100 mg total) by mouth daily. 90 tablet 3  . traZODone (DESYREL) 100 MG tablet Take 1 tablet (100 mg total) by mouth at  bedtime. 90 tablet 3  . polyethylene glycol powder (GLYCOLAX/MIRALAX) powder Take 17 g by mouth daily. 850 g 12   No current facility-administered medications for this visit.    No Known Allergies  Health Maintenance Health Maintenance  Topic Date Due  . HIV Screening  08/28/1988  . INFLUENZA VACCINE  02/16/2018  . TETANUS/TDAP  09/28/2026    Discussed warning signs or symptoms. Please see discharge instructions. Patient expresses understanding.

## 2017-12-08 NOTE — Patient Instructions (Signed)
Thank you for coming in today. You should hear from General Surgery about hernia appointment soon.   For constipation eat plenty of fiber.  Consider metamucil.  Use miralax also  Recheck as needed.    Fiber Content in Foods See the following list for the dietary fiber content of some common foods. High-fiber foods High-fiber foods contain 4 grams or more (4g or more) of fiber per serving. They include:  Artichoke (fresh) - 1 medium has 10.3g of fiber.  Baked beans, plain or vegetarian (canned) -  cup has 5.2g of fiber.  Blackberries or raspberries (fresh) -  cup has 4g of fiber.  Bran cereal -  cup has 8.6g of fiber.  Bulgur (cooked) -  cup has 4g of fiber.  Kidney beans (canned) -  cup has 6.8g of fiber.  Lentils (cooked) -  cup has 7.8g of fiber.  Pear (fresh) - 1 medium has 5.1g of fiber.  Peas (frozen) -  cup has 4.4g of fiber.  Pinto beans (canned) -  cup has 5.5g of fiber.  Pinto beans (dried and cooked) -  cup has 7.7g of fiber.  Potato with skin (baked) - 1 medium has 4.4g of fiber.  Quinoa (cooked) -  cup has 5g of fiber.  Soybeans (canned, frozen, or fresh) -  cup has 5.1g of fiber.  Moderate-fiber foods Moderate-fiber foods contain 1-4 grams (1-4g) of fiber per serving. They include:  Almonds - 1 oz. has 3.5g of fiber.  Apple with skin - 1 medium has 3.3g of fiber.  Applesauce, sweetened -  cup has 1.5g of fiber.  Bagel, plain - one 4-inch (10-cm) bagel has 2g of fiber.  Banana - 1 medium has 3.1g of fiber.  Broccoli (cooked) -  cup has 2.5g of fiber.  Carrots (cooked) -  cup has 2.3g of fiber.  Corn (canned or frozen) -  cup has 2.1g of fiber.  Corn tortilla - one 6-inch (15-cm) tortilla has 1.5g of fiber.  Green beans (canned) -  cup has 2g of fiber.  Instant oatmeal -  cup has about 2g of fiber.  Long-grain brown rice (cooked) - 1 cup has 3.5g of fiber.  Macaroni, enriched (cooked) - 1 cup has 2.5g of fiber.  Melon  - 1 cup has 1.4g of fiber.  Multigrain cereal -  cup has about 2-4g of fiber.  Orange - 1 small has 3.1g of fiber.  Potatoes, mashed -  cup has 1.6g of fiber.  Raisins - 1/4 cup has 1.6g of fiber.  Squash -  cup has 2.9g of fiber.  Sunflower seeds -  cup has 1.1g of fiber.  Tomato - 1 medium has 1.5g of fiber.  Vegetable or soy patty - 1 has 3.4g of fiber.  Whole-wheat bread - 1 slice has 2g of fiber.  Whole-wheat spaghetti -  cup has 3.2g of fiber.  Low-fiber foods Low-fiber foods contain less than 1 gram (less than 1g) of fiber per serving. They include:  Egg - 1 large.  Flour tortilla - one 6-inch (15-cm) tortilla.  Fruit juice -  cup.  Lettuce - 1 cup.  Meat, poultry, or fish - 1 oz.  Milk - 1 cup.  Spinach (raw) - 1 cup.  White bread - 1 slice.  White rice -  cup.  Yogurt -  cup.  Actual amounts of fiber in foods may be different depending on processing. Talk with your dietitian about how much fiber you need in your diet. This information is  not intended to replace advice given to you by your health care provider. Make sure you discuss any questions you have with your health care provider. Document Released: 11/21/2006 Document Revised: 12/11/2015 Document Reviewed: 08/28/2015 Elsevier Interactive Patient Education  2018 [employer].

## 2018-01-11 DIAGNOSIS — K429 Umbilical hernia without obstruction or gangrene: Secondary | ICD-10-CM | POA: Diagnosis not present

## 2018-07-05 ENCOUNTER — Ambulatory Visit (INDEPENDENT_AMBULATORY_CARE_PROVIDER_SITE_OTHER): Payer: BLUE CROSS/BLUE SHIELD

## 2018-07-05 ENCOUNTER — Ambulatory Visit (INDEPENDENT_AMBULATORY_CARE_PROVIDER_SITE_OTHER): Payer: BLUE CROSS/BLUE SHIELD | Admitting: Sports Medicine

## 2018-07-05 ENCOUNTER — Encounter: Payer: Self-pay | Admitting: Sports Medicine

## 2018-07-05 DIAGNOSIS — R062 Wheezing: Secondary | ICD-10-CM

## 2018-07-05 MED ORDER — AZITHROMYCIN 250 MG PO TABS: ORAL_TABLET | ORAL | 0 refills | Status: DC

## 2018-07-05 MED ORDER — PREDNISONE 50 MG PO TABS: 50.0000 mg | ORAL_TABLET | Freq: Every day | ORAL | 0 refills | Status: DC

## 2018-07-05 NOTE — Progress Notes (Signed)
Subjective:    CC: Feeling sick  HPI: For the past few days this 42 year old male heavy smoker has had facial pain and pressure, nasal discharge, sore throat and a cough.  No fevers, chills, no GI symptoms.  Symptoms are moderate, persistent.  I reviewed the past medical history, family history, social history, surgical history, and allergies today and no changes were needed.  Please see the problem list section below in epic for further details.  Past Medical History: Past Medical History:  Diagnosis Date  . Kidney stone    Past Surgical History: Past Surgical History:  Procedure Laterality Date  . KIDNEY STONE SURGERY     Social History: Social History   Socioeconomic History  . Marital status: Married    Spouse name: Not on file  . Number of children: Not on file  . Years of education: Not on file  . Highest education level: Not on file  Occupational History  . Not on file  Social Needs  . Financial resource strain: Not on file  . Food insecurity:    Worry: Not on file    Inability: Not on file  . Transportation needs:    Medical: Not on file    Non-medical: Not on file  Tobacco Use  . Smoking status: Current Every Day Smoker    Packs/day: 1.00  . Smokeless tobacco: Never Used  Substance and Sexual Activity  . Alcohol use: Yes    Alcohol/week: 0.0 standard drinks  . Drug use: No  . Sexual activity: Not on file  Lifestyle  . Physical activity:    Days per week: Not on file    Minutes per session: Not on file  . Stress: Not on file  Relationships  . Social connections:    Talks on phone: Not on file    Gets together: Not on file    Attends religious service: Not on file    Active member of club or organization: Not on file    Attends meetings of clubs or organizations: Not on file    Relationship status: Not on file  Other Topics Concern  . Not on file  Social History Narrative  . Not on file   Family History: Family History  Problem Relation Age  of Onset  . Alcohol abuse Father   . Diabetes Father   . Cancer Father   . Alcohol abuse Brother    Allergies: No Known Allergies Medications: See med rec.  Review of Systems: No fevers, chills, night sweats, weight loss, chest pain, or shortness of breath.   Objective:    General: Well Developed, well nourished, and in no acute distress.  Neuro: Alert and oriented x3, extra-ocular muscles intact, sensation grossly intact.  HEENT: Normocephalic, atraumatic, pupils equal round reactive to light, neck supple, no masses, no lymphadenopathy, thyroid nonpalpable.  Oropharynx, nasopharynx, ear canals are unremarkable. Skin: Warm and dry, no rashes. Cardiac: Regular rate and rhythm, no murmurs rubs or gallops, no lower extremity edema.  Respiratory: Coarse expiratory wheezes in the left lower lobe. Not using accessory muscles, speaking in full sentences.  Impression and Recommendations:    Wheezing Likely an early COPD type exacerbation with facial pressure, postnasal drip and left lower lobe wheezing. Considering possibility of pneumonia in a heavy smoker we are going to treat aggressively with azithromycin, prednisone, chest x-ray. Return to see me or PCP if no better in a week or 2. ___________________________________________ Ihor Austinhomas J. Benjamin Stainhekkekandam, M.D., ABFM., CAQSM. Primary Care and Sports Medicine [name]  Adjunct Professor of Family Medicine  [employer]

## 2018-07-05 NOTE — Assessment & Plan Note (Signed)
Likely an early COPD type exacerbation with facial pressure, postnasal drip and left lower lobe wheezing. Considering possibility of pneumonia in a heavy smoker we are going to treat aggressively with azithromycin, prednisone, chest x-ray. Return to see me or PCP if no better in a week or 2.

## 2018-09-13 ENCOUNTER — Ambulatory Visit (INDEPENDENT_AMBULATORY_CARE_PROVIDER_SITE_OTHER): Payer: BLUE CROSS/BLUE SHIELD | Admitting: Family Medicine

## 2018-09-13 ENCOUNTER — Encounter: Payer: Self-pay | Admitting: Family Medicine

## 2018-09-13 VITALS — BP 109/63 | HR 80 | Temp 98.1°F | Ht 67.0 in | Wt 164.0 lb

## 2018-09-13 DIAGNOSIS — J0100 Acute maxillary sinusitis, unspecified: Secondary | ICD-10-CM

## 2018-09-13 MED ORDER — IPRATROPIUM BROMIDE 0.06 % NA SOLN: 2.0000 | NASAL | 6 refills | Status: DC | PRN

## 2018-09-13 MED ORDER — PREDNISONE 50 MG PO TABS: 50.0000 mg | ORAL_TABLET | Freq: Every day | ORAL | 0 refills | Status: DC

## 2018-09-13 NOTE — Progress Notes (Signed)
Adam Norton is a 43 y.o. male who presents to [name]: Primary Care Sports Medicine today for sinus pressure runny nose congestion and mild cough.  Symptoms present for about 1 week.  Patient is tried some Claritin and some leftover Atrovent nasal spray which helped a little.  He notes runny nose is especially bothersome.  He denies trouble breathing shortness of breath or chest pain or palpitations.   ROS as above:  Exam:  BP 109/63   Pulse 80   Temp 98.1 F (36.7 C) (Oral)   Ht 5\' 7"  (1.702 m)   Wt 164 lb (74.4 kg)   SpO2 98%   BMI 25.69 kg/m  Wt Readings from Last 5 Encounters:  09/13/18 164 lb (74.4 kg)  07/05/18 162 lb (73.5 kg)  12/08/17 164 lb (74.4 kg)  11/29/17 165 lb (74.8 kg)  05/20/17 159 lb (72.1 kg)    Gen: Well NAD HEENT: EOMI,  MMM clear nasal discharge with inflamed nasal turbinates bilaterally.  Tympanic membranes with mild serous effusion without retraction or erythema bilaterally right worse than left.  Mild cervical lymphadenopathy present bilaterally.  Posterior pharynx with mild cobblestoning without erythema.  Lungs: Normal work of breathing. CTABL Heart: RRR no MRG Abd: NABS, Soft. Nondistended, Nontender Exts: Brisk capillary refill, warm and well perfused.   Lab and Radiology Results No results found for this or any previous visit (from the past 72 hour(s)). No results found.    Assessment and Plan: 43 y.o. male with sinusitis likely viral initially.  Plan to continue and maximize over-the-counter medications.  Use Atrovent nasal spray to manage rhinitis and postnasal drainage.  Printed backup prednisone course to take if not improving or if worsening.  Would consider antibiotics as well in the future if not improving.  PDMP not reviewed this encounter. No orders of the defined types were placed in this encounter.  Meds ordered this encounter    Medications  . ipratropium (ATROVENT) 0.06 % nasal spray    Sig: Place 2 sprays into both nostrils every 4 (four) hours as needed.    Dispense:  10 mL    Refill:  6  . predniSONE (DELTASONE) 50 MG tablet    Sig: Take 1 tablet (50 mg total) by mouth daily.    Dispense:  5 tablet    Refill:  0     Historical information moved to improve visibility of documentation.  Past Medical History:  Diagnosis Date  . Kidney stone    Past Surgical History:  Procedure Laterality Date  . KIDNEY STONE SURGERY     Social History   Tobacco Use  . Smoking status: Current Every Day Smoker    Packs/day: 1.00  . Smokeless tobacco: Never Used  Substance Use Topics  . Alcohol use: Yes    Alcohol/week: 0.0 standard drinks   family history includes Alcohol abuse in his brother and father; Cancer in his father; Diabetes in his father.  Medications: Current Outpatient Medications  Medication Sig Dispense Refill  . albuterol (VENTOLIN HFA) 108 (90 Base) MCG/ACT inhaler Inhale 1-2 puffs into the lungs every 4 (four) hours as needed for wheezing or shortness of breath. 1 Inhaler 0  . loratadine (CLARITIN) 10 MG tablet Take 10 mg by mouth daily.    . polyethylene glycol powder (GLYCOLAX/MIRALAX) powder Take 17 g by mouth daily. 850 g 12  . predniSONE (DELTASONE) 50 MG tablet Take 1 tablet (50 mg total) by mouth daily. 5  tablet 0  . sertraline (ZOLOFT) 100 MG tablet Take 1 tablet (100 mg total) by mouth daily. 90 tablet 3  . traZODone (DESYREL) 100 MG tablet Take 1 tablet (100 mg total) by mouth at bedtime. 90 tablet 3  . ipratropium (ATROVENT) 0.06 % nasal spray Place 2 sprays into both nostrils every 4 (four) hours as needed. 10 mL 6   No current facility-administered medications for this visit.    No Known Allergies   Discussed warning signs or symptoms. Please see discharge instructions. Patient expresses understanding.

## 2018-09-13 NOTE — Patient Instructions (Addendum)
Thank you for coming in today. Call or go to the emergency room if you get worse, have trouble breathing, have chest pains, or palpitations.  Take over the counter medicines.  Use atrovent nasal spray as needed for runny nose.  Use over the counter flonase nasal spray for allergies in addition to claritin.   If not improving take prednisone.   If worsening let me know. Next step is antibiotics.   Recheck as needed.     Sinusitis, Adult Sinusitis is inflammation of your sinuses. Sinuses are hollow spaces in the bones around your face. Your sinuses are located:  Around your eyes.  In the middle of your forehead.  Behind your nose.  In your cheekbones. Mucus normally drains out of your sinuses. When your nasal tissues become inflamed or swollen, mucus can become trapped or blocked. This allows bacteria, viruses, and fungi to grow, which leads to infection. Most infections of the sinuses are caused by a virus. Sinusitis can develop quickly. It can last for up to 4 weeks (acute) or for more than 12 weeks (chronic). Sinusitis often develops after a cold. What are the causes? This condition is caused by anything that creates swelling in the sinuses or stops mucus from draining. This includes:  Allergies.  Asthma.  Infection from bacteria or viruses.  Deformities or blockages in your nose or sinuses.  Abnormal growths in the nose (nasal polyps).  Pollutants, such as chemicals or irritants in the air.  Infection from fungi (rare). What increases the risk? You are more likely to develop this condition if you:  Have a weak body defense system (immune system).  Do a lot of swimming or diving.  Overuse nasal sprays.  Smoke. What are the signs or symptoms? The main symptoms of this condition are pain and a feeling of pressure around the affected sinuses. Other symptoms include:  Stuffy nose or congestion.  Thick drainage from your nose.  Swelling and warmth over the  affected sinuses.  Headache.  Upper toothache.  A cough that may get worse at night.  Extra mucus that collects in the throat or the back of the nose (postnasal drip).  Decreased sense of smell and taste.  Fatigue.  A fever.  Sore throat.  Bad breath. How is this diagnosed? This condition is diagnosed based on:  Your symptoms.  Your medical history.  A physical exam.  Tests to find out if your condition is acute or chronic. This may include: ? Checking your nose for nasal polyps. ? Viewing your sinuses using a device that has a light (endoscope). ? Testing for allergies or bacteria. ? Imaging tests, such as an MRI or CT scan. In rare cases, a bone biopsy may be done to rule out more serious types of fungal sinus disease. How is this treated? Treatment for sinusitis depends on the cause and whether your condition is chronic or acute.  If caused by a virus, your symptoms should go away on their own within 10 days. You may be given medicines to relieve symptoms. They include: ? Medicines that shrink swollen nasal passages (topical intranasal decongestants). ? Medicines that treat allergies (antihistamines). ? A spray that eases inflammation of the nostrils (topical intranasal corticosteroids). ? Rinses that help get rid of thick mucus in your nose (nasal saline washes).  If caused by bacteria, your health care provider may recommend waiting to see if your symptoms improve. Most bacterial infections will get better without antibiotic medicine. You may be given antibiotics if  you have: ? A severe infection. ? A weak immune system.  If caused by narrow nasal passages or nasal polyps, you may need to have surgery. Follow these instructions at home: Medicines  Take, use, or apply over-the-counter and prescription medicines only as told by your health care provider. These may include nasal sprays.  If you were prescribed an antibiotic medicine, take it as told by your  health care provider. Do not stop taking the antibiotic even if you start to feel better. Hydrate and humidify   Drink enough fluid to keep your urine pale yellow. Staying hydrated will help to thin your mucus.  Use a cool mist humidifier to keep the humidity level in your home above 50%.  Inhale steam for 10-15 minutes, 3-4 times a day, or as told by your health care provider. You can do this in the bathroom while a hot shower is running.  Limit your exposure to cool or dry air. Rest  Rest as much as possible.  Sleep with your head raised (elevated).  Make sure you get enough sleep each night. General instructions   Apply a warm, moist washcloth to your face 3-4 times a day or as told by your health care provider. This will help with discomfort.  Wash your hands often with soap and water to reduce your exposure to germs. If soap and water are not available, use hand sanitizer.  Do not smoke. Avoid being around people who are smoking (secondhand smoke).  Keep all follow-up visits as told by your health care provider. This is important. Contact a health care provider if:  You have a fever.  Your symptoms get worse.  Your symptoms do not improve within 10 days. Get help right away if:  You have a severe headache.  You have persistent vomiting.  You have severe pain or swelling around your face or eyes.  You have vision problems.  You develop confusion.  Your neck is stiff.  You have trouble breathing. Summary  Sinusitis is soreness and inflammation of your sinuses. Sinuses are hollow spaces in the bones around your face.  This condition is caused by nasal tissues that become inflamed or swollen. The swelling traps or blocks the flow of mucus. This allows bacteria, viruses, and fungi to grow, which leads to infection.  If you were prescribed an antibiotic medicine, take it as told by your health care provider. Do not stop taking the antibiotic even if you start to  feel better.  Keep all follow-up visits as told by your health care provider. This is important. This information is not intended to replace advice given to you by your health care provider. Make sure you discuss any questions you have with your health care provider. Document Released: 07/05/2005 Document Revised: 12/05/2017 Document Reviewed: 12/05/2017 Elsevier Interactive Patient Education  2019 [employer].

## 2018-10-04 ENCOUNTER — Other Ambulatory Visit: Payer: Self-pay

## 2018-10-04 ENCOUNTER — Ambulatory Visit: Payer: BLUE CROSS/BLUE SHIELD | Admitting: Family Medicine

## 2018-10-04 ENCOUNTER — Encounter: Payer: Self-pay | Admitting: Family Medicine

## 2018-10-04 VITALS — BP 112/67 | HR 72 | Temp 98.1°F | Wt 164.0 lb

## 2018-10-04 DIAGNOSIS — J0111 Acute recurrent frontal sinusitis: Secondary | ICD-10-CM | POA: Diagnosis not present

## 2018-10-04 DIAGNOSIS — F3342 Major depressive disorder, recurrent, in full remission: Secondary | ICD-10-CM | POA: Diagnosis not present

## 2018-10-04 DIAGNOSIS — G4700 Insomnia, unspecified: Secondary | ICD-10-CM | POA: Diagnosis not present

## 2018-10-04 MED ORDER — FLUTICASONE PROPIONATE 50 MCG/ACT NA SUSP: NASAL | 3 refills | Status: DC

## 2018-10-04 MED ORDER — TRAZODONE HCL 100 MG PO TABS: 100.0000 mg | ORAL_TABLET | Freq: Every day | ORAL | 3 refills | Status: DC

## 2018-10-04 MED ORDER — SERTRALINE HCL 100 MG PO TABS: 100.0000 mg | ORAL_TABLET | Freq: Every day | ORAL | 3 refills | Status: DC

## 2018-10-04 MED ORDER — PREDNISONE 50 MG PO TABS: 50.0000 mg | ORAL_TABLET | Freq: Every day | ORAL | 0 refills | Status: DC

## 2018-10-04 MED ORDER — BENZONATATE 200 MG PO CAPS: 200.0000 mg | ORAL_CAPSULE | Freq: Three times a day (TID) | ORAL | 0 refills | Status: DC | PRN

## 2018-10-04 MED ORDER — CEFDINIR 300 MG PO CAPS: 300.0000 mg | ORAL_CAPSULE | Freq: Two times a day (BID) | ORAL | 0 refills | Status: DC

## 2018-10-04 NOTE — Patient Instructions (Addendum)
Thank you for coming in today.  I recommend muccinex DM for cough.  Use tessalon as needed.  Use the nasal spray.  Take the prednisone and antibiotic.  Call or go to the emergency room if you get worse, have trouble breathing, have chest pains, or palpitations.    Sinusitis, Adult Sinusitis is inflammation of your sinuses. Sinuses are hollow spaces in the bones around your face. Your sinuses are located:  Around your eyes.  In the middle of your forehead.  Behind your nose.  In your cheekbones. Mucus normally drains out of your sinuses. When your nasal tissues become inflamed or swollen, mucus can become trapped or blocked. This allows bacteria, viruses, and fungi to grow, which leads to infection. Most infections of the sinuses are caused by a virus. Sinusitis can develop quickly. It can last for up to 4 weeks (acute) or for more than 12 weeks (chronic). Sinusitis often develops after a cold. What are the causes? This condition is caused by anything that creates swelling in the sinuses or stops mucus from draining. This includes:  Allergies.  Asthma.  Infection from bacteria or viruses.  Deformities or blockages in your nose or sinuses.  Abnormal growths in the nose (nasal polyps).  Pollutants, such as chemicals or irritants in the air.  Infection from fungi (rare). What increases the risk? You are more likely to develop this condition if you:  Have a weak body defense system (immune system).  Do a lot of swimming or diving.  Overuse nasal sprays.  Smoke. What are the signs or symptoms? The main symptoms of this condition are pain and a feeling of pressure around the affected sinuses. Other symptoms include:  Stuffy nose or congestion.  Thick drainage from your nose.  Swelling and warmth over the affected sinuses.  Headache.  Upper toothache.  A cough that may get worse at night.  Extra mucus that collects in the throat or the back of the nose (postnasal  drip).  Decreased sense of smell and taste.  Fatigue.  A fever.  Sore throat.  Bad breath. How is this diagnosed? This condition is diagnosed based on:  Your symptoms.  Your medical history.  A physical exam.  Tests to find out if your condition is acute or chronic. This may include: ? Checking your nose for nasal polyps. ? Viewing your sinuses using a device that has a light (endoscope). ? Testing for allergies or bacteria. ? Imaging tests, such as an MRI or CT scan. In rare cases, a bone biopsy may be done to rule out more serious types of fungal sinus disease. How is this treated? Treatment for sinusitis depends on the cause and whether your condition is chronic or acute.  If caused by a virus, your symptoms should go away on their own within 10 days. You may be given medicines to relieve symptoms. They include: ? Medicines that shrink swollen nasal passages (topical intranasal decongestants). ? Medicines that treat allergies (antihistamines). ? A spray that eases inflammation of the nostrils (topical intranasal corticosteroids). ? Rinses that help get rid of thick mucus in your nose (nasal saline washes).  If caused by bacteria, your health care provider may recommend waiting to see if your symptoms improve. Most bacterial infections will get better without antibiotic medicine. You may be given antibiotics if you have: ? A severe infection. ? A weak immune system.  If caused by narrow nasal passages or nasal polyps, you may need to have surgery. Follow these instructions  at home: Medicines  Take, use, or apply over-the-counter and prescription medicines only as told by your health care provider. These may include nasal sprays.  If you were prescribed an antibiotic medicine, take it as told by your health care provider. Do not stop taking the antibiotic even if you start to feel better. Hydrate and humidify   Drink enough fluid to keep your urine pale yellow.  Staying hydrated will help to thin your mucus.  Use a cool mist humidifier to keep the humidity level in your home above 50%.  Inhale steam for 10-15 minutes, 3-4 times a day, or as told by your health care provider. You can do this in the bathroom while a hot shower is running.  Limit your exposure to cool or dry air. Rest  Rest as much as possible.  Sleep with your head raised (elevated).  Make sure you get enough sleep each night. General instructions   Apply a warm, moist washcloth to your face 3-4 times a day or as told by your health care provider. This will help with discomfort.  Wash your hands often with soap and water to reduce your exposure to germs. If soap and water are not available, use hand sanitizer.  Do not smoke. Avoid being around people who are smoking (secondhand smoke).  Keep all follow-up visits as told by your health care provider. This is important. Contact a health care provider if:  You have a fever.  Your symptoms get worse.  Your symptoms do not improve within 10 days. Get help right away if:  You have a severe headache.  You have persistent vomiting.  You have severe pain or swelling around your face or eyes.  You have vision problems.  You develop confusion.  Your neck is stiff.  You have trouble breathing. Summary  Sinusitis is soreness and inflammation of your sinuses. Sinuses are hollow spaces in the bones around your face.  This condition is caused by nasal tissues that become inflamed or swollen. The swelling traps or blocks the flow of mucus. This allows bacteria, viruses, and fungi to grow, which leads to infection.  If you were prescribed an antibiotic medicine, take it as told by your health care provider. Do not stop taking the antibiotic even if you start to feel better.  Keep all follow-up visits as told by your health care provider. This is important. This information is not intended to replace advice given to you by  your health care provider. Make sure you discuss any questions you have with your health care provider. Document Released: 07/05/2005 Document Revised: 12/05/2017 Document Reviewed: 12/05/2017 Elsevier Interactive Patient Education  2019 [employer].

## 2018-10-04 NOTE — Progress Notes (Signed)
Adam Norton is a 43 y.o. male who presents to [name] today for headache nasal congestion starting yesterday.  He notes a little bit of runny nose and postnasal drainage but denies severe rhinitis.  He notes a little bit of itchy watery eyes and a little bit of sneezing.  He notes a mild dry cough as well.  He denies fevers chills body aches.  He denies significant sick contacts.  He is tried some over-the-counter medications which have helped a little.  He has not started any allergy medications yet.  Additionally he notes that his depression is well controlled.  He takes sertraline and trazodone below.  He feels well with how things are going.  ROS as above:  Exam:  BP 112/67   Pulse 72   Temp 98.1 F (36.7 C) (Oral)   Wt 164 lb (74.4 kg)   SpO2 97%   BMI 25.69 kg/m  Wt Readings from Last 5 Encounters:  10/04/18 164 lb (74.4 kg)  09/13/18 164 lb (74.4 kg)  07/05/18 162 lb (73.5 kg)  12/08/17 164 lb (74.4 kg)  11/29/17 165 lb (74.8 kg)    Gen: Well NAD HEENT: EOMI,  MMM inflamed nasal turbinates bilaterally.  No significant nasal discharge.  Tender to palpation frontal sinuses bilaterally.  Nontender maxillary sinuses.  Posterior pharynx with cobblestoning.  Minimal cervical lymphadenopathy present bilaterally. Lungs: Normal work of breathing. CTABL Heart: RRR no MRG Abd: NABS, Soft. Nondistended, Nontender Exts: Brisk capillary refill, warm and well perfused.  Alert and oriented normal speech thought process and affect.  No SI or HI expressed.  Depression screen [facility] 2/9 10/04/2018 07/05/2018 11/29/2017 02/22/2017 08/23/2016  Decreased Interest 1 1 1 1  0  Down, Depressed, Hopeless 0 1 1 1  0  PHQ - 2 Score 1 2 2 2  0  Altered sleeping 0 1 0 1 3  Tired, decreased energy 1 1 2 2 1   Change in appetite 0 0 0 0 0  Feeling bad or failure about yourself  0 0 0 0 0   Trouble concentrating 1 0 1 1 1   Moving slowly or fidgety/restless 0 0 0 0 0  Suicidal thoughts 0 0 0 0 0  PHQ-9 Score 3 4 5 6 5   Difficult doing work/chores Not difficult at all Not difficult at all - - -        Assessment and Plan: 43 y.o. male with sinusitis likely is the cause of headache.  I think patient probably has second sickening with sinusitis.  Likely patient has seasonal allergies causing nasal congestion and fluid retention and has developed secondary bacterial sinus infection.  Will start over-the-counter antihistamines as well as Flonase nasal spray.  Will treat probable bacterial sinus infection with Ceftin ear and prednisone.  Will treat symptomatically also with Tessalon Perles for cough.  Recommend over-the-counter medications for cough as well.  Depression well controlled.  Continue sertraline and trazodone.  PDMP not reviewed this encounter. No orders of the defined types were placed in this encounter.  Meds ordered this encounter  Medications  . cefdinir (OMNICEF) 300 MG capsule    Sig: Take 1 capsule (300 mg total) by mouth 2 (two) times daily.    Dispense:  14 capsule    Refill:  0  . predniSONE (DELTASONE) 50 MG tablet    Sig: Take 1 tablet (50 mg total) by mouth daily.    Dispense:  5 tablet  Refill:  0  . fluticasone (FLONASE) 50 MCG/ACT nasal spray    Sig: One spray in each nostril twice a day, use left hand for right nostril, and right hand for left nostril.    Dispense:  48 g    Refill:  3  . traZODone (DESYREL) 100 MG tablet    Sig: Take 1 tablet (100 mg total) by mouth at bedtime.    Dispense:  90 tablet    Refill:  3  . sertraline (ZOLOFT) 100 MG tablet    Sig: Take 1 tablet (100 mg total) by mouth daily.    Dispense:  90 tablet    Refill:  3  . benzonatate (TESSALON) 200 MG capsule    Sig: Take 1 capsule (200 mg total) by mouth 3 (three) times daily as needed for cough.    Dispense:  45 capsule    Refill:  0     Historical  information moved to improve visibility of documentation.  Past Medical History:  Diagnosis Date  . Kidney stone    Past Surgical History:  Procedure Laterality Date  . KIDNEY STONE SURGERY     Social History   Tobacco Use  . Smoking status: Current Every Day Smoker    Packs/day: 1.00  . Smokeless tobacco: Never Used  Substance Use Topics  . Alcohol use: Yes    Alcohol/week: 0.0 standard drinks   family history includes Alcohol abuse in his brother and father; Cancer in his father; Diabetes in his father.  Medications: Current Outpatient Medications  Medication Sig Dispense Refill  . albuterol (VENTOLIN HFA) 108 (90 Base) MCG/ACT inhaler Inhale 1-2 puffs into the lungs every 4 (four) hours as needed for wheezing or shortness of breath. 1 Inhaler 0  . loratadine (CLARITIN) 10 MG tablet Take 10 mg by mouth daily.    . polyethylene glycol powder (GLYCOLAX/MIRALAX) powder Take 17 g by mouth daily. 850 g 12  . predniSONE (DELTASONE) 50 MG tablet Take 1 tablet (50 mg total) by mouth daily. 5 tablet 0  . sertraline (ZOLOFT) 100 MG tablet Take 1 tablet (100 mg total) by mouth daily. 90 tablet 3  . traZODone (DESYREL) 100 MG tablet Take 1 tablet (100 mg total) by mouth at bedtime. 90 tablet 3  . benzonatate (TESSALON) 200 MG capsule Take 1 capsule (200 mg total) by mouth 3 (three) times daily as needed for cough. 45 capsule 0  . cefdinir (OMNICEF) 300 MG capsule Take 1 capsule (300 mg total) by mouth 2 (two) times daily. 14 capsule 0  . fluticasone (FLONASE) 50 MCG/ACT nasal spray One spray in each nostril twice a day, use left hand for right nostril, and right hand for left nostril. 48 g 3   No current facility-administered medications for this visit.    No Known Allergies   Discussed warning signs or symptoms. Please see discharge instructions. Patient expresses understanding.

## 2018-11-20 ENCOUNTER — Encounter: Payer: Self-pay | Admitting: Family Medicine

## 2018-11-20 ENCOUNTER — Ambulatory Visit (INDEPENDENT_AMBULATORY_CARE_PROVIDER_SITE_OTHER): Payer: BLUE CROSS/BLUE SHIELD | Admitting: Family Medicine

## 2018-11-20 VITALS — BP 116/67 | HR 75 | Temp 97.9°F | Wt 165.0 lb

## 2018-11-20 DIAGNOSIS — Z202 Contact with and (suspected) exposure to infections with a predominantly sexual mode of transmission: Secondary | ICD-10-CM

## 2018-11-20 DIAGNOSIS — Z114 Encounter for screening for human immunodeficiency virus [HIV]: Secondary | ICD-10-CM | POA: Diagnosis not present

## 2018-11-20 DIAGNOSIS — E785 Hyperlipidemia, unspecified: Secondary | ICD-10-CM | POA: Diagnosis not present

## 2018-11-20 NOTE — Patient Instructions (Signed)
Thank you for coming in today. Get labs now.  I will get results to you ASAP.  Return sooner if needed.

## 2018-11-20 NOTE — Progress Notes (Signed)
Adam Norton is a [age] y.o. male who [facility] today for STD exposure.  Adam Norton had unprotected sex with a woman in mid April.  She notified him that she has since tested positive for herpes and he is concerned and would like STD testing.  He is never had herpes per his knowledge and is currently asymptomatic with no discharge urinary frequency or dysuria.  He feels pretty well otherwise.  He denies any penile lesions.  ROS as above:  Exam:  BP 116/67   Pulse 75   Temp 97.9 F (36.6 C) (Oral)   Wt 165 lb (74.8 kg)   BMI 25.84 kg/m  Wt Readings from Last 5 Encounters:  11/20/18 165 lb (74.8 kg)  10/04/18 164 lb (74.4 kg)  09/13/18 164 lb (74.4 kg)  07/05/18 162 lb (73.5 kg)  12/08/17 164 lb (74.4 kg)    Gen: Well NAD HEENT: EOMI,  MMM Lungs: Normal work of breathing. CTABL Heart: RRR no MRG Abd: NABS, Soft. Nondistended, Nontender Exts: Brisk capillary refill, warm and well perfused.  Genitals: Normal circumcised penis with no discharge or lesions present.  Testicles are descended bilaterally nontender with no masses or nodules.  No inguinal lymphadenopathy present.     Assessment and Plan: 43 y.o. male with  STD exposure.  Discussed treatment options and testing options.  Reasonable to test for GC chlamydia HIV and syphilis.  Although he is never had herpes to his knowledge and does not currently have an outbreak it is reasonable to do antibody testing.  We did explain caveats that she has V antibody testing is not a very good test but he would like to proceed.  Also patient is overdue for hyperlipidemia recheck.  Labs were last drawn in 2018.  Will check CBC metabolic panel and direct LDL.  PDMP not reviewed this encounter. Orders Placed This Encounter  Procedures  . C. trachomatis/N. gonorrhoeae RNA  . HSV(herpes simplex vrs) 1+2 ab-IgG   . HIV Antibody (routine testing w rflx)  . RPR  . LDL cholesterol, direct  . COMPLETE METABOLIC PANEL WITH GFR  . CBC   No orders of the defined types were placed in this encounter.    Historical information moved to improve visibility of documentation.  Past Medical History:  Diagnosis Date  . Kidney stone    Past Surgical History:  Procedure Laterality Date  . KIDNEY STONE SURGERY     Social History   Tobacco Use  . Smoking status: Current Every Day Smoker    Packs/day: 1.00  . Smokeless tobacco: Never Used  Substance Use Topics  . Alcohol use: Yes    Alcohol/week: 0.0 standard drinks   family history includes Alcohol abuse in his brother and father; Cancer in his father; Diabetes in his father.  Medications: Current Outpatient Medications  Medication Sig Dispense Refill  . albuterol (VENTOLIN HFA) 108 (90 Base) MCG/ACT inhaler Inhale 1-2 puffs into the lungs every 4 (four) hours as needed for wheezing or shortness of breath. 1 Inhaler 0  . benzonatate (TESSALON) 200 MG capsule Take 1 capsule (200 mg total) by mouth 3 (three) times daily as needed for cough. 45 capsule 0  . fluticasone (FLONASE) 50 MCG/ACT nasal spray One spray in each nostril twice a day, use left hand for right nostril, and right hand for left nostril. 48 g 3  . loratadine (CLARITIN) 10 MG tablet Take 10 mg by mouth  daily.    . polyethylene glycol powder (GLYCOLAX/MIRALAX) powder Take 17 g by mouth daily. 850 g 12  . predniSONE (DELTASONE) 50 MG tablet Take 1 tablet (50 mg total) by mouth daily. 5 tablet 0  . sertraline (ZOLOFT) 100 MG tablet Take 1 tablet (100 mg total) by mouth daily. 90 tablet 3  . traZODone (DESYREL) 100 MG tablet Take 1 tablet (100 mg total) by mouth at bedtime. 90 tablet 3  . cefdinir (OMNICEF) 300 MG capsule Take 1 capsule (300 mg total) by mouth 2 (two) times daily. (Patient not taking: Reported on 11/20/2018) 14 capsule 0   No current facility-administered medications for this  visit.    No Known Allergies   Discussed warning signs or symptoms. Please see discharge instructions. Patient expresses understanding.

## 2018-11-21 LAB — CBC
HCT: 40.6 % (ref 38.5–50.0)
Hemoglobin: 13.1 g/dL — ABNORMAL LOW (ref 13.2–17.1)
MCH: 26.1 pg — ABNORMAL LOW (ref 27.0–33.0)
MCHC: 32.3 g/dL (ref 32.0–36.0)
MCV: 80.9 fL (ref 80.0–100.0)
MPV: 9.1 fL (ref 7.5–12.5)
Platelets: 487 10*3/uL — ABNORMAL HIGH (ref 140–400)
RBC: 5.02 10*6/uL (ref 4.20–5.80)
RDW: 15.7 % — ABNORMAL HIGH (ref 11.0–15.0)
WBC: 11.6 10*3/uL — ABNORMAL HIGH (ref 3.8–10.8)

## 2018-11-21 LAB — C. TRACHOMATIS/N. GONORRHOEAE RNA
C. trachomatis RNA, TMA: NOT DETECTED
N. gonorrhoeae RNA, TMA: NOT DETECTED

## 2018-11-21 LAB — COMPLETE METABOLIC PANEL WITH GFR
AG Ratio: 1.5 (calc) (ref 1.0–2.5)
ALT: 15 U/L (ref 9–46)
AST: 11 U/L (ref 10–40)
Albumin: 4.2 g/dL (ref 3.6–5.1)
Alkaline phosphatase (APISO): 100 U/L (ref 36–130)
BUN: 14 mg/dL (ref 7–25)
CO2: 22 mmol/L (ref 20–32)
Calcium: 9.5 mg/dL (ref 8.6–10.3)
Chloride: 106 mmol/L (ref 98–110)
Creat: 0.77 mg/dL (ref 0.60–1.35)
GFR, Est African American: 127 mL/min/{1.73_m2} (ref >=60)
GFR, Est Non African American: 110 mL/min/{1.73_m2} (ref >=60)
Globulin: 2.8 g/dL (calc) (ref 1.9–3.7)
Glucose, Bld: 100 mg/dL — ABNORMAL HIGH (ref 65–99)
Potassium: 4.2 mmol/L (ref 3.5–5.3)
Sodium: 137 mmol/L (ref 135–146)
Total Bilirubin: 0.2 mg/dL (ref 0.2–1.2)
Total Protein: 7 g/dL (ref 6.1–8.1)

## 2018-11-21 LAB — HIV ANTIBODY (ROUTINE TESTING W REFLEX): HIV 1&2 Ab, 4th Generation: NONREACTIVE

## 2018-11-21 LAB — LDL CHOLESTEROL, DIRECT: Direct LDL: 147 mg/dL — ABNORMAL HIGH (ref <100)

## 2018-11-21 LAB — RPR: RPR Ser Ql: NONREACTIVE

## 2018-11-21 LAB — HSV(HERPES SIMPLEX VRS) I + II AB-IGG
HAV 1 IGG,TYPE SPECIFIC AB: <0.9 index
HSV 2 IGG,TYPE SPECIFIC AB: <0.9 index

## 2018-12-13 ENCOUNTER — Telehealth: Payer: Self-pay | Admitting: Family Medicine

## 2018-12-13 NOTE — Telephone Encounter (Signed)
FMLA: I received FMLA request from your employer.  He had a family previously for intermittent leave for neck and shoulder pain.  I want to make sure that is what were recertifying.  If this FMLA form is for something different please let me know.  Otherwise I will proceed to renew your FMLA form the way it was done previously.

## 2018-12-13 NOTE — Telephone Encounter (Signed)
He states it is for his anxiety. Patient scheduled for today.

## 2018-12-14 ENCOUNTER — Ambulatory Visit: Payer: BLUE CROSS/BLUE SHIELD | Admitting: Family Medicine

## 2018-12-15 ENCOUNTER — Ambulatory Visit: Payer: BLUE CROSS/BLUE SHIELD | Admitting: Family Medicine

## 2018-12-15 ENCOUNTER — Encounter: Payer: Self-pay | Admitting: Family Medicine

## 2018-12-15 VITALS — BP 117/73 | HR 75 | Temp 98.1°F | Wt 166.0 lb

## 2018-12-15 DIAGNOSIS — G4700 Insomnia, unspecified: Secondary | ICD-10-CM | POA: Diagnosis not present

## 2018-12-15 DIAGNOSIS — F411 Generalized anxiety disorder: Secondary | ICD-10-CM

## 2018-12-15 DIAGNOSIS — F325 Major depressive disorder, single episode, in full remission: Secondary | ICD-10-CM | POA: Diagnosis not present

## 2018-12-15 NOTE — Progress Notes (Signed)
Adam Norton is a 43 y.o. male who presents to [name]: Primary Care Sports Medicine today for anxiety follow-up.  Adam Norton has a history of anxiety and depression.  This originated in 2017 during a break-up with his now ex-wife.  He has had reasonable control of his symptoms with Zoloft and trazodone.  He notes it waxes and wanes and notes a little bit worse recently but not by much worse.  He notes occasionally he will miss work and needs an Transport planner form filled out.  He feels well otherwise with no fevers or chills nausea vomiting or diarrhea.   ROS as above:  Exam:  BP 117/73   Pulse 75   Temp 98.1 F (36.7 C) (Oral)   Wt 166 lb (75.3 kg)   BMI 26.00 kg/m  Wt Readings from Last 5 Encounters:  12/15/18 166 lb (75.3 kg)  11/20/18 165 lb (74.8 kg)  10/04/18 164 lb (74.4 kg)  09/13/18 164 lb (74.4 kg)  07/05/18 162 lb (73.5 kg)    Gen: Well NAD .  Psych alert and oriented normal speech thought process and affect.  No SI or HI expressed.  Depression screen [facility] 2/9 12/15/2018 10/04/2018 07/05/2018 11/29/2017 02/22/2017  Decreased Interest 2 1 1 1 1   Down, Depressed, Hopeless 0 0 1 1 1   PHQ - 2 Score 2 1 2 2 2   Altered sleeping 1 0 1 0 1  Tired, decreased energy 1 1 1 2 2   Change in appetite 0 0 0 0 0  Feeling bad or failure about yourself  0 0 0 0 0  Trouble concentrating 1 1 0 1 1  Moving slowly or fidgety/restless 0 0 0 0 0  Suicidal thoughts 0 0 0 0 0  PHQ-9 Score 5 3 4 5 6   Difficult doing work/chores Somewhat difficult Not difficult at all Not difficult at all - -    GAD 7 : Generalized Anxiety Score 12/15/2018 07/05/2018 11/29/2017  Nervous, Anxious, on Edge 1 1 1   Control/stop worrying 1 0 0  Worry too much - different things 1 0 0  Trouble relaxing 1 0 0  Restless 1 0 0  Easily annoyed or irritable 1 0 0  Afraid - awful might happen 0 0 0  Total GAD 7 Score 6 1 1   Anxiety  Difficulty Somewhat difficult Not difficult at all Not difficult at all      Lab and Radiology Results No results found for this or any previous visit (from the past 72 hour(s)). No results found.    Assessment and Plan: 43 y.o. male with mood: Stable to slightly worse.  Discussed options.  Continue Zoloft and trazodone.  Patient declined counseling referral.  Would consider BuSpar in future as well.  Recheck as needed.  FMLA form completed.  PDMP not reviewed this encounter. No orders of the defined types were placed in this encounter.  No orders of the defined types were placed in this encounter.    Historical information moved to improve visibility of documentation.  Past Medical History:  Diagnosis Date  . Kidney stone    Past Surgical History:  Procedure Laterality Date  . KIDNEY STONE SURGERY     Social History   Tobacco Use  . Smoking status: Current Every Day Smoker    Packs/day: 1.00  . Smokeless tobacco: Never Used  Substance Use Topics  . Alcohol use: Yes    Alcohol/week: 0.0 standard drinks   family  history includes Alcohol abuse in his brother and father; Cancer in his father; Diabetes in his father.  Medications: Current Outpatient Medications  Medication Sig Dispense Refill  . albuterol (VENTOLIN HFA) 108 (90 Base) MCG/ACT inhaler Inhale 1-2 puffs into the lungs every 4 (four) hours as needed for wheezing or shortness of breath. 1 Inhaler 0  . benzonatate (TESSALON) 200 MG capsule Take 1 capsule (200 mg total) by mouth 3 (three) times daily as needed for cough. 45 capsule 0  . fluticasone (FLONASE) 50 MCG/ACT nasal spray One spray in each nostril twice a day, use left hand for right nostril, and right hand for left nostril. 48 g 3  . loratadine (CLARITIN) 10 MG tablet Take 10 mg by mouth daily.    . polyethylene glycol powder (GLYCOLAX/MIRALAX) powder Take 17 g by mouth daily. 850 g 12  . sertraline (ZOLOFT) 100 MG tablet Take 1 tablet (100 mg total)  by mouth daily. 90 tablet 3  . traZODone (DESYREL) 100 MG tablet Take 1 tablet (100 mg total) by mouth at bedtime. 90 tablet 3   No current facility-administered medications for this visit.    No Known Allergies   Discussed warning signs or symptoms. Please see discharge instructions. Patient expresses understanding.

## 2018-12-15 NOTE — Patient Instructions (Signed)
Thank you for coming in today. Call or go to the emergency room if you get worse, have trouble breathing, have chest pains, or palpitations.  Continue current medications.  I will send the FMLA form off.   Consider therapy in the near future.

## 2019-03-13 ENCOUNTER — Telehealth: Payer: Self-pay | Admitting: Family Medicine

## 2019-03-13 NOTE — Telephone Encounter (Signed)
Physician certification for ADAAA accommodation form completed.  Will fax to phone number on form.  Will also send copy to scan and keep a copy.   Additionally notify patient that I will be moving to full time Sports Medicine in [place] starting on November 1st.  You will still be able to see me for your Sports Medicine or Orthopedic needs in the [facility] Location. I will still be part of [place].    [address], [place] [address]  [phone]  Telephone (phone line will be functional starting in November).  [phone] Fax [phone] Concussion Line  If you want to stay locally for your Sports Medicine issues Dr. Dianah Field here in [place] will be happy to see you.  Additionally Dr. Clearance Coots at [facility] will be happy to see you for sports medicine issues more locally.   For your primary care needs you are welcome to establish care with Dr. Emeterio Reeve.  We are working quickly to hire more physicians to cover the primary care needs however if you cannot get an appointment with Dr. Sheppard Coil in a timely manner [facility] has locations and openings for primary care services nearby.   [facility] [address] . [place] , [place]: [phone] . Behavioral Medicine: [phone] . Fax: [place] at [employer] [address] . [place], [place]: [phone] . Behavioral Medicine: [phone] . Fax: [phone] . Hours (M-F): 7am - Academic librarian At [facility]. [place], [place]: [phone] . Behavioral Medicine: [phone] . Fax: [phone] . Hours (M-F): 8am - Optician, dispensing at [employer] . [place], [place] Phone: [phone] . Behavioral Medicine: [phone] . Fax:  [phone]

## 2019-07-19 ENCOUNTER — Telehealth: Payer: Self-pay

## 2019-07-19 NOTE — Telephone Encounter (Signed)
Adam Norton called with symptoms of diarrhea. Denies chronic diarrhea, recent change in diet, family members with diarrhea, recent travel to a foreign country, fever unresponsive to fever-reducing measures, new medications or blood in stool. He reports no other symptoms. I did advise him if symptoms worsen or he has abdominal pain to go to the urgent care to be evaluated. I did advise him to isolate until symptoms have resolved.   Advised of clear liquid diet first 12 to 24 hours. Sips of water, flat soda, clear broth, gelatin (not red) or flavored ice.  Next 12 hours, progress to eating soups (avoiding cream soups), dry toast, soda crackers, white rice, pretzels, bananas, applesauce and potatoes.  Progress to a regular diet after soft formed stools occur.   Avoid dairy products, citrus juices, raw fruits and vegetables, and fried or spicy foods for 2 to 5 days after diarrhea subsides.  After 6 hours of diarrhea and cramping, or if pain persists, OTC antidiarrheal medications (Imodium, Kaopectate, Pepto-Bismol) can be used. Follow the instructions on the label.   Acetaminophen can be given for fever.

## 2019-07-19 NOTE — Telephone Encounter (Signed)
Agree with documentation as above.   Zakira Ressel, MD  

## 2019-08-22 ENCOUNTER — Ambulatory Visit (INDEPENDENT_AMBULATORY_CARE_PROVIDER_SITE_OTHER): Payer: BC Managed Care – PPO | Admitting: Family Medicine

## 2019-08-22 ENCOUNTER — Encounter: Payer: Self-pay | Admitting: Family Medicine

## 2019-08-22 ENCOUNTER — Other Ambulatory Visit: Payer: Self-pay

## 2019-08-22 VITALS — BP 112/65 | HR 80 | Temp 98.0°F | Ht 67.0 in | Wt 158.5 lb

## 2019-08-22 DIAGNOSIS — F411 Generalized anxiety disorder: Secondary | ICD-10-CM

## 2019-08-22 DIAGNOSIS — F3342 Major depressive disorder, recurrent, in full remission: Secondary | ICD-10-CM | POA: Diagnosis not present

## 2019-08-22 DIAGNOSIS — F325 Major depressive disorder, single episode, in full remission: Secondary | ICD-10-CM

## 2019-08-22 DIAGNOSIS — F5104 Psychophysiologic insomnia: Secondary | ICD-10-CM | POA: Diagnosis not present

## 2019-08-22 DIAGNOSIS — F17210 Nicotine dependence, cigarettes, uncomplicated: Secondary | ICD-10-CM

## 2019-08-22 MED ORDER — SERTRALINE HCL 100 MG PO TABS: 100.0000 mg | ORAL_TABLET | Freq: Every day | ORAL | 3 refills | Status: DC

## 2019-08-22 MED ORDER — TRAZODONE HCL 100 MG PO TABS: 100.0000 mg | ORAL_TABLET | Freq: Every day | ORAL | 3 refills | Status: DC

## 2019-08-22 NOTE — Patient Instructions (Signed)
Great to meet you today! Continue current medications See me again in 6 months.    Managing Anxiety, Adult After being diagnosed with an anxiety disorder, you may be relieved to know why you have felt or behaved a certain way. You may also feel overwhelmed about the treatment ahead and what it will mean for your life. With care and support, you can manage this condition and recover from it. How to manage lifestyle changes Managing stress and anxiety  Stress is your body's reaction to life changes and events, both good and bad. Most stress will last just a few hours, but stress can be ongoing and can lead to more than just stress. Although stress can play a major role in anxiety, it is not the same as anxiety. Stress is usually caused by something external, such as a deadline, test, or competition. Stress normally passes after the triggering event has ended.  Anxiety is caused by something internal, such as imagining a terrible outcome or worrying that something will go wrong that will devastate you. Anxiety often does not go away even after the triggering event is over, and it can become long-term (chronic) worry. It is important to understand the differences between stress and anxiety and to manage your stress effectively so that it does not lead to an anxious response. Talk with your health care provider or a counselor to learn more about reducing anxiety and stress. He or she may suggest tension reduction techniques, such as:  Music therapy. This can include creating or listening to music that you enjoy and that inspires you.  Mindfulness-based meditation. This involves being aware of your normal breaths while not trying to control your breathing. It can be done while sitting or walking.  Centering prayer. This involves focusing on a word, phrase, or sacred image that means something to you and brings you peace.  Deep breathing. To do this, expand your stomach and inhale slowly through your  nose. Hold your breath for 3-5 seconds. Then exhale slowly, letting your stomach muscles relax.  Self-talk. This involves identifying thought patterns that lead to anxiety reactions and changing those patterns.  Muscle relaxation. This involves tensing muscles and then relaxing them. Choose a tension reduction technique that suits your lifestyle and personality. These techniques take time and practice. Set aside 5-15 minutes a day to do them. Therapists can offer counseling and training in these techniques. The training to help with anxiety may be covered by some insurance plans. Other things you can do to manage stress and anxiety include:  Keeping a stress/anxiety diary. This can help you learn what triggers your reaction and then learn ways to manage your response.  Thinking about how you react to certain situations. You may not be able to control everything, but you can control your response.  Making time for activities that help you relax and not feeling guilty about spending your time in this way.  Visual imagery and yoga can help you stay calm and relax.  Medicines Medicines can help ease symptoms. Medicines for anxiety include:  Anti-anxiety drugs.  Antidepressants. Medicines are often used as a primary treatment for anxiety disorder. Medicines will be prescribed by a health care provider. When used together, medicines, psychotherapy, and tension reduction techniques may be the most effective treatment. Relationships Relationships can play a big part in helping you recover. Try to spend more time connecting with trusted friends and family members. Consider going to couples counseling, taking family education classes, or going to family  therapy. Therapy can help you and others better understand your condition. How to recognize changes in your anxiety Everyone responds differently to treatment for anxiety. Recovery from anxiety happens when symptoms decrease and stop interfering with  your daily activities at home or work. This may mean that you will start to:  Have better concentration and focus. Worry will interfere less in your daily thinking.  Sleep better.  Be less irritable.  Have more energy.  Have improved memory. It is important to recognize when your condition is getting worse. Contact your health care provider if your symptoms interfere with home or work and you feel like your condition is not improving. Follow these instructions at home: Activity  Exercise. Most adults should do the following: ? Exercise for at least 150 minutes each week. The exercise should increase your heart rate and make you sweat (moderate-intensity exercise). ? Strengthening exercises at least twice a week.  Get the right amount and quality of sleep. Most adults need 7-9 hours of sleep each night. Lifestyle   Eat a healthy diet that includes plenty of vegetables, fruits, whole grains, low-fat dairy products, and lean protein. Do not eat a lot of foods that are high in solid fats, added sugars, or salt.  Make choices that simplify your life.  Do not use any products that contain nicotine or tobacco, such as cigarettes, e-cigarettes, and chewing tobacco. If you need help quitting, ask your health care provider.  Avoid caffeine, alcohol, and certain over-the-counter cold medicines. These may make you feel worse. Ask your pharmacist which medicines to avoid. General instructions  Take over-the-counter and prescription medicines only as told by your health care provider.  Keep all follow-up visits as told by your health care provider. This is important. Where to find support You can get help and support from these sources:  Self-help groups.  Online and [employer].  A trusted spiritual leader.  Couples counseling.  Family education classes.  Family therapy. Where to find more information You may find that joining a support group helps you deal with your  anxiety. The following sources can help you locate counselors or support groups near you:  Mental Health America: www.mentalhealthamerica.net  Anxiety and Depression Association of [place] (ADAA): ProgramCam.de  The [employer] on Mental Illness (NAMI): www.nami.org Contact a health care provider if you:  Have a hard time staying focused or finishing daily tasks.  Spend many hours a day feeling worried about everyday life.  Become exhausted by worry.  Start to have headaches, feel tense, or have nausea.  Urinate more than normal.  Have diarrhea. Get help right away if you have:  A racing heart and shortness of breath.  Thoughts of hurting yourself or others. If you ever feel like you may hurt yourself or others, or have thoughts about taking your own life, get help right away. You can go to your nearest emergency department or call:  Your local emergency services (911 in the [place]  A suicide crisis helpline, such as the National Suicide Prevention Lifeline at (726)389-9398. This is open 24 hours a day. Summary  Taking steps to learn and use tension reduction techniques can help calm you and help prevent triggering an anxiety reaction.  When used together, medicines, psychotherapy, and tension reduction techniques may be the most effective treatment.  Family, friends, and partners can play a big part in helping you recover from an anxiety disorder. This information is not intended to replace advice given to you by your health  care provider. Make sure you discuss any questions you have with your health care provider. Document Revised: 12/05/2018 Document Reviewed: 12/05/2018 Elsevier Patient Education  2020 [employer].

## 2019-08-22 NOTE — Assessment & Plan Note (Signed)
Insomnia symptoms are well controlled with trazodone at bedtime, continue at current dose.

## 2019-08-22 NOTE — Assessment & Plan Note (Signed)
Depression and anxiety symptoms are well controlled with sertraline at this time.  Continue at current dose with follow up in 6 months or sooner if needed.

## 2019-08-22 NOTE — Assessment & Plan Note (Signed)
Counseled on smoking cessation.  Declines medications or other interventions to help with quitting.

## 2019-08-22 NOTE — Progress Notes (Signed)
Adam Norton - 44 y.o. male MRN [id number]  Date of birth: [birth year]  Subjective Chief Complaint  Patient presents with  . Follow-up    HPI Adam Norton is a 44 y.o. male with history of depression with anxiety and insomnia here today for follow up.  This is currently managed with sertraline daily and trazodone at bedtime.  He reports that this is working very well for him.  He denies side effects including feeling too sedated for groggy in the morning with trazodone.  He does continue to smoke but declines any medication to help with this.    ROS:  A comprehensive ROS was completed and negative except as noted per HPI  No Known Allergies  Past Medical History:  Diagnosis Date  . Kidney stone     Past Surgical History:  Procedure Laterality Date  . KIDNEY STONE SURGERY      Social History   Socioeconomic History  . Marital status: Married    Spouse name: Not on file  . Number of children: Not on file  . Years of education: Not on file  . Highest education level: Not on file  Occupational History  . Not on file  Tobacco Use  . Smoking status: Current Every Day Smoker    Packs/day: 1.00  . Smokeless tobacco: Never Used  Substance and Sexual Activity  . Alcohol use: Yes    Alcohol/week: 0.0 standard drinks  . Drug use: No  . Sexual activity: Not on file  Other Topics Concern  . Not on file  Social History Narrative  . Not on file   Social Determinants of Health   Financial Resource Strain:   . Difficulty of Paying Living Expenses: Not on file  Food Insecurity:   . Worried About Programme researcher, broadcasting/film/video in the Last Year: Not on file  . Ran Out of Food in the Last Year: Not on file  Transportation Needs:   . Lack of Transportation (Medical): Not on file  . Lack of Transportation (Non-Medical): Not on file  Physical Activity:   . Days of Exercise per Week: Not on file  . Minutes of Exercise per Session: Not on file  Stress:   . Feeling of Stress : Not on file   Social Connections:   . Frequency of Communication with Friends and Family: Not on file  . Frequency of Social Gatherings with Friends and Family: Not on file  . Attends Religious Services: Not on file  . Active Member of Clubs or Organizations: Not on file  . Attends Banker Meetings: Not on file  . Marital Status: Not on file    Family History  Problem Relation Age of Onset  . Alcohol abuse Father   . Diabetes Father   . Cancer Father   . Alcohol abuse Brother     Health Maintenance  Topic Date Due  . INFLUENZA VACCINE  10/17/2019 (Originally 02/17/2019)  . TETANUS/TDAP  09/28/2026  . HIV Screening  Completed    ----------------------------------------------------------------------------------------------------------------------------------------------------------------------------------------------------------------- Physical Exam BP 112/65   Pulse 80   Temp 98 F (36.7 C) (Oral)   Ht 5\' 7"  (1.702 m)   Wt 158 lb 8 oz (71.9 kg)   BMI 24.82 kg/m   Physical Exam Constitutional:      Appearance: Normal appearance.  HENT:     Head: Normocephalic and atraumatic.  Eyes:     General: No scleral icterus. Cardiovascular:     Rate and Rhythm: Normal rate and regular rhythm.  Pulmonary:     Effort: Pulmonary effort is normal.     Breath sounds: Normal breath sounds.  Skin:    General: Skin is warm and dry.  Neurological:     General: No focal deficit present.     Mental Status: He is alert.  Psychiatric:        Mood and Affect: Mood normal.        Behavior: Behavior normal.     ------------------------------------------------------------------------------------------------------------------------------------------------------------------------------------------------------------------- Assessment and Plan  Major depression in complete remission (HCC) Depression and anxiety symptoms are well controlled with sertraline at this time.  Continue at  current dose with follow up in 6 months or sooner if needed.   Insomnia Insomnia symptoms are well controlled with trazodone at bedtime, continue at current dose.   Nicotine dependence Counseled on smoking cessation.  Declines medications or other interventions to help with quitting.     This visit occurred during the SARS-CoV-2 public health emergency.  Safety protocols were in place, including screening questions prior to the visit, additional usage of staff PPE, and extensive cleaning of exam room while observing appropriate contact time as indicated for disinfecting solutions.

## 2019-10-16 ENCOUNTER — Telehealth: Payer: Self-pay | Admitting: Family Medicine

## 2019-10-16 NOTE — Telephone Encounter (Signed)
Patient called and wanted to check the status of the FMLA. I have not seen anything for this come across my desk. Did he talk with you at the visit from February? Please advise.

## 2019-10-17 NOTE — Telephone Encounter (Signed)
I have not received any FMLA paperwork for this patient.

## 2019-10-17 NOTE — Telephone Encounter (Signed)
Patient is coming by today to drop off FMLA forms.  Front staff aware.  No other questions

## 2019-10-24 DIAGNOSIS — F411 Generalized anxiety disorder: Secondary | ICD-10-CM

## 2019-10-24 NOTE — Telephone Encounter (Signed)
I have faxed the FMLA forms per PCP request.  I also placed a copy in the chart and the patient is aware. No other concerns.

## 2019-10-30 ENCOUNTER — Other Ambulatory Visit: Payer: Self-pay | Admitting: Family Medicine

## 2020-05-27 ENCOUNTER — Other Ambulatory Visit: Payer: Self-pay

## 2020-05-28 ENCOUNTER — Ambulatory Visit (INDEPENDENT_AMBULATORY_CARE_PROVIDER_SITE_OTHER): Payer: Self-pay | Admitting: Family Medicine

## 2020-05-28 ENCOUNTER — Encounter: Payer: Self-pay | Admitting: Family Medicine

## 2020-05-28 VITALS — BP 116/60 | HR 76 | Temp 98.2°F | Wt 171.9 lb

## 2020-05-28 DIAGNOSIS — F325 Major depressive disorder, single episode, in full remission: Secondary | ICD-10-CM

## 2020-05-28 DIAGNOSIS — F411 Generalized anxiety disorder: Secondary | ICD-10-CM

## 2020-05-28 DIAGNOSIS — F3342 Major depressive disorder, recurrent, in full remission: Secondary | ICD-10-CM

## 2020-05-28 DIAGNOSIS — Z7185 Encounter for immunization safety counseling: Secondary | ICD-10-CM | POA: Insufficient documentation

## 2020-05-28 MED ORDER — SERTRALINE HCL 100 MG PO TABS: 100.0000 mg | ORAL_TABLET | Freq: Every day | ORAL | 3 refills | Status: DC

## 2020-05-28 MED ORDER — TRAZODONE HCL 100 MG PO TABS: 100.0000 mg | ORAL_TABLET | Freq: Every day | ORAL | 3 refills | Status: DC

## 2020-05-28 NOTE — Progress Notes (Signed)
Adam Norton - 44 y.o. male MRN [id number]  Date of birth: [birth year]  Subjective Chief Complaint  Patient presents with  . Medication Refill    HPI Adam Norton is a 44 y.o. male here today for follow up of depression and anxiety.  He reports that he continues to do well with sertraline for depression and anxiety and trazodone for insomnia.  He is not experiencing any side effects related to medication.   Depression screen [facility] 2/9 05/28/2020 08/22/2019 12/15/2018  Decreased Interest 0 1 2  Down, Depressed, Hopeless 1 1 0  PHQ - 2 Score 1 2 2   Altered sleeping 0 0 1  Tired, decreased energy 1 1 1   Change in appetite 0 0 0  Feeling bad or failure about yourself  0 0 0  Trouble concentrating 0 0 1  Moving slowly or fidgety/restless 0 0 0  Suicidal thoughts 0 0 0  PHQ-9 Score 2 3 5   Difficult doing work/chores Not difficult at all Not difficult at all Somewhat difficult   GAD 7 : Generalized Anxiety Score 05/28/2020 08/22/2019 12/15/2018 07/05/2018  Nervous, Anxious, on Edge 0 0 1 1  Control/stop worrying 0 0 1 0  Worry too much - different things 0 0 1 0  Trouble relaxing 0 0 1 0  Restless 0 0 1 0  Easily annoyed or irritable 0 0 1 0  Afraid - awful might happen 0 0 0 0  Total GAD 7 Score 0 0 6 1  Anxiety Difficulty - Not difficult at all Somewhat difficult Not difficult at all     No Known Allergies  Past Medical History:  Diagnosis Date  . Kidney stone     Past Surgical History:  Procedure Laterality Date  . KIDNEY STONE SURGERY      Social History   Socioeconomic History  . Marital status: Married    Spouse name: Not on file  . Number of children: Not on file  . Years of education: Not on file  . Highest education level: Not on file  Occupational History  . Not on file  Tobacco Use  . Smoking status: Current Every Day Smoker    Packs/day: 1.00  . Smokeless tobacco: Never Used  Vaping Use  . Vaping Use: Never used  Substance and Sexual Activity  . Alcohol  use: Yes    Alcohol/week: 0.0 standard drinks  . Drug use: No  . Sexual activity: Not on file  Other Topics Concern  . Not on file  Social History Narrative  . Not on file   Social Determinants of Health   Financial Resource Strain:   . Difficulty of Paying Living Expenses: Not on file  Food Insecurity:   . Worried About 10/20/2019 in the Last Year: Not on file  . Ran Out of Food in the Last Year: Not on file  Transportation Needs:   . Lack of Transportation (Medical): Not on file  . Lack of Transportation (Non-Medical): Not on file  Physical Activity:   . Days of Exercise per Week: Not on file  . Minutes of Exercise per Session: Not on file  Stress:   . Feeling of Stress : Not on file  Social Connections:   . Frequency of Communication with Friends and Family: Not on file  . Frequency of Social Gatherings with Friends and Family: Not on file  . Attends Religious Services: Not on file  . Active Member of Clubs or Organizations: Not on file  .  Attends Banker Meetings: Not on file  . Marital Status: Not on file    Family History  Problem Relation Age of Onset  . Alcohol abuse Father   . Diabetes Father   . Cancer Father   . Alcohol abuse Brother     Health Maintenance  Topic Date Due  . Hepatitis C Screening  Never done  . COVID-19 Vaccine (1) Never done  . INFLUENZA VACCINE  02/17/2020  . TETANUS/TDAP  09/28/2026  . HIV Screening  Completed     ----------------------------------------------------------------------------------------------------------------------------------------------------------------------------------------------------------------- Physical Exam BP 116/60 (BP Location: Left Arm, Patient Position: Sitting, Cuff Size: Normal)   Pulse 76   Temp 98.2 F (36.8 C)   Wt 171 lb 14.4 oz (78 kg)   SpO2 96%   BMI 26.92 kg/m   Physical Exam Constitutional:      Appearance: Normal appearance.  Eyes:     General: No scleral  icterus. Cardiovascular:     Rate and Rhythm: Normal rate and regular rhythm.  Pulmonary:     Effort: Pulmonary effort is normal.     Breath sounds: Normal breath sounds.  Musculoskeletal:     Cervical back: Neck supple.  Neurological:     General: No focal deficit present.     Mental Status: He is alert.  Psychiatric:        Mood and Affect: Mood normal.        Behavior: Behavior normal.     ------------------------------------------------------------------------------------------------------------------------------------------------------------------------------------------------------------------- Assessment and Plan  Major depression in complete remission (HCC) Depression and anxiety remain well controlled with current strength of sertraline.  Insomnia remains well controlled with trazodone.  Will continue these medications with plan for f/u in 6 months.   Vaccine counseling Counseled on COVID and flu vaccine today and recommendations to receive these. He declines flu and does not plan to get COVID vaccine.    Meds ordered this encounter  Medications  . sertraline (ZOLOFT) 100 MG tablet    Sig: Take 1 tablet (100 mg total) by mouth daily.    Dispense:  90 tablet    Refill:  3  . traZODone (DESYREL) 100 MG tablet    Sig: Take 1 tablet (100 mg total) by mouth at bedtime.    Dispense:  90 tablet    Refill:  3    Return in about 6 months (around 11/25/2020) for depression/anxiety.    This visit occurred during the SARS-CoV-2 public health emergency.  Safety protocols were in place, including screening questions prior to the visit, additional usage of staff PPE, and extensive cleaning of exam room while observing appropriate contact time as indicated for disinfecting solutions.

## 2020-05-28 NOTE — Assessment & Plan Note (Signed)
Depression and anxiety remain well controlled with current strength of sertraline.  Insomnia remains well controlled with trazodone.  Will continue these medications with plan for f/u in 6 months.

## 2020-05-28 NOTE — Assessment & Plan Note (Signed)
Counseled on COVID and flu vaccine today and recommendations to receive these. He declines flu and does not plan to get COVID vaccine.

## 2020-10-11 ENCOUNTER — Other Ambulatory Visit: Payer: Self-pay

## 2020-10-11 ENCOUNTER — Emergency Department (HOSPITAL_BASED_OUTPATIENT_CLINIC_OR_DEPARTMENT_OTHER)
Admission: EM | Admit: 2020-10-11 | Discharge: 2020-10-11 | Disposition: A | Discharge status: Home / Self Care | Payer: No Typology Code available for payment source | Attending: Emergency Medicine | Admitting: Emergency Medicine

## 2020-10-11 ENCOUNTER — Emergency Department (HOSPITAL_BASED_OUTPATIENT_CLINIC_OR_DEPARTMENT_OTHER): Payer: No Typology Code available for payment source

## 2020-10-11 ENCOUNTER — Encounter (HOSPITAL_BASED_OUTPATIENT_CLINIC_OR_DEPARTMENT_OTHER): Payer: Self-pay | Admitting: Emergency Medicine

## 2020-10-11 DIAGNOSIS — Y990 Civilian activity done for income or pay: Secondary | ICD-10-CM | POA: Insufficient documentation

## 2020-10-11 DIAGNOSIS — W208XXA Other cause of strike by thrown, projected or falling object, initial encounter: Secondary | ICD-10-CM | POA: Diagnosis not present

## 2020-10-11 DIAGNOSIS — S6991XA Unspecified injury of right wrist, hand and finger(s), initial encounter: Secondary | ICD-10-CM | POA: Diagnosis present

## 2020-10-11 DIAGNOSIS — F17200 Nicotine dependence, unspecified, uncomplicated: Secondary | ICD-10-CM | POA: Insufficient documentation

## 2020-10-11 DIAGNOSIS — W010XXA Fall on same level from slipping, tripping and stumbling without subsequent striking against object, initial encounter: Secondary | ICD-10-CM | POA: Insufficient documentation

## 2020-10-11 DIAGNOSIS — S50811A Abrasion of right forearm, initial encounter: Secondary | ICD-10-CM | POA: Insufficient documentation

## 2020-10-11 DIAGNOSIS — S471XXA Crushing injury of right shoulder and upper arm, initial encounter: Secondary | ICD-10-CM | POA: Diagnosis not present

## 2020-10-11 MED ORDER — IBUPROFEN 800 MG PO TABS
800.0000 mg | ORAL_TABLET | Freq: Once | ORAL | Status: AC
  Administered 2020-10-11: 800 mg via ORAL
  Filled 2020-10-11: qty 1

## 2020-10-11 NOTE — ED Triage Notes (Signed)
Pt reports pallet bander fell and landed on patients right distal forearm, reports right anterior hand and forearm pain. Pt denies numbness or tingling. WC, possible UDS

## 2020-10-11 NOTE — ED Provider Notes (Signed)
[facility] EMERGENCY DEPARTMENT Provider Note   CSN: [id number] Arrival date & time: 10/11/20  1233     History No chief complaint on file.   Adam Norton is a 45 y.o. male.  45 year old male presents with complaint of right arm and hand injury.  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  Pain is located along the mid to distal right radius into the right second carpal.  Minor abrasions present, skin intact otherwise.  Patient is right-hand dominant.  No other injuries or concerns.        Past Medical History:  Diagnosis Date  . Kidney stone     Patient Active Problem List   Diagnosis Date Noted  . Vaccine counseling 05/28/2020  . Wheezing 07/05/2018  . Umbilical hernia 11/29/2017  . Cannabis abuse 07/03/2017  . Poor dentition 05/20/2017  . Nicotine dependence 04/12/2017  . Nephrolithiasis 11/04/2016  . Vitamin D deficiency 08/23/2016  . Arthropathy of cervical facet joint 01/13/2016  . Insomnia 08/25/2015  . Generalized anxiety disorder 08/18/2015  . Major depression in complete remission (HCC) 08/18/2015  . Depression 08/18/2015    Past Surgical History:  Procedure Laterality Date  . KIDNEY STONE SURGERY         Family History  Problem Relation Age of Onset  . Alcohol abuse Father   . Diabetes Father   . Cancer Father   . Alcohol abuse Brother     Social History   Tobacco Use  . Smoking status: Current Every Day Smoker    Packs/day: 1.00  . Smokeless tobacco: Never Used  Vaping Use  . Vaping Use: Never used  Substance Use Topics  . Alcohol use: Not Currently    Alcohol/week: 0.0 standard drinks  . Drug use: No    Home Medications Prior to Admission medications   Medication Sig Start Date End Date Taking? Authorizing Provider  albuterol (VENTOLIN HFA) 108 (90 Base) MCG/ACT inhaler Inhale 1-2 puffs into the lungs every 4 (four) hours as needed for wheezing  or shortness of breath. 04/12/17   Carlis Stable, PA-C  fluticasone [facility]) 50 MCG/ACT nasal spray One spray in each nostril twice a day, use left hand for right nostril, and right hand for left nostril. 10/04/18   Rodolph Bong, MD  loratadine (CLARITIN) 10 MG tablet Take 10 mg by mouth daily.    [provider]  sertraline (ZOLOFT) 100 MG tablet Take 1 tablet (100 mg total) by mouth daily. 05/28/20   Everrett Coombe, DO  traZODone (DESYREL) 100 MG tablet Take 1 tablet (100 mg total) by mouth at bedtime. 05/28/20   Everrett Coombe, DO    Allergies    Patient has no known allergies.  Review of Systems   Review of Systems  Constitutional: Negative for fever.  Musculoskeletal: Positive for arthralgias, joint swelling and myalgias.  Skin: Positive for wound. Negative for color change and rash.  Allergic/Immunologic: Negative for immunocompromised state.  Neurological: Negative for weakness and numbness.  Hematological: Does not bruise/bleed easily.  Psychiatric/Behavioral: Negative for self-injury.  All other systems reviewed and are negative.   Physical Exam Updated Vital Signs BP 103/64 (BP Location: Left Arm)   Pulse 75   Temp 98 F (36.7 C) (Oral)   Resp 16   Ht 5\' 8"  (1.727 m)   Wt 71.2 kg   SpO2 97%   BMI 23.87 kg/m   Physical  Exam Vitals and nursing note reviewed.  Constitutional:      General: He is not in acute distress.    Appearance: He is well-developed. He is not diaphoretic.  HENT:     Head: Normocephalic and atraumatic.  Cardiovascular:     Pulses: Normal pulses.  Pulmonary:     Effort: Pulmonary effort is normal.  Musculoskeletal:        General: Tenderness present. No swelling. Normal range of motion.     Comments: Tenderness along mid to distal right radius extending to right 2nd metacarpal. Minor abrasions present, normal ROM elbow, wrist, hand. Sensation intact, brisk capillary refill present in each digit.   Skin:    General:  Skin is warm and dry.     Findings: No bruising, erythema or rash.  Neurological:     Mental Status: He is alert and oriented to person, place, and time.     Sensory: No sensory deficit.  Psychiatric:        Behavior: Behavior normal.     ED Results / Procedures / Treatments   Labs (all labs ordered are listed, but only abnormal results are displayed) Labs Reviewed - No data to display  EKG None  Radiology DG Forearm Right  Result Date: 10/11/2020 CLINICAL DATA:  Crush injury EXAM: RIGHT FOREARM - 2 VIEW; RIGHT HAND - COMPLETE 3+ VIEW COMPARISON:  None. FINDINGS: There is no evidence of fracture or other focal bone lesions. Soft tissues are unremarkable. IMPRESSION: No fracture or dislocation of the right hand or right forearm. Electronically Signed   By: Lauralyn Primes M.D.   On: 10/11/2020 13:35   DG Hand Complete Right  Result Date: 10/11/2020 CLINICAL DATA:  Crush injury EXAM: RIGHT FOREARM - 2 VIEW; RIGHT HAND - COMPLETE 3+ VIEW COMPARISON:  None. FINDINGS: There is no evidence of fracture or other focal bone lesions. Soft tissues are unremarkable. IMPRESSION: No fracture or dislocation of the right hand or right forearm. Electronically Signed   By: Lauralyn Primes M.D.   On: 10/11/2020 13:35    Procedures Procedures   Medications Ordered in ED Medications  ibuprofen (ADVIL) tablet 800 mg (800 mg Oral Given 10/11/20 1328)    ED Course  I have reviewed the triage vital signs and the nursing notes.  Pertinent labs & imaging results that were available during my care of the patient were reviewed by me and considered in my medical decision making (see chart for details).  Clinical Course as of 10/11/20 1348  Sat Oct 11, 2020  [id number] 45 year old male with complaint of right arm and hand injury at work today as above.  On exam found to have minor abrasion to the right forearm and hand with tenderness to the distal radius and second metacarpal. X-rays negative for fracture.  Patient  was given ibuprofen for his pain. Recommend Velcro splint to use as needed for comfort.  Motrin and Tylenol, ice and elevate and follow-up with Worker's Comp. provider in the next 2 days. [LM]    Clinical Course User Index [LM] Alden Hipp   MDM Rules/Calculators/A&P                          Final Clinical Impression(s) / ED Diagnoses Final diagnoses:  Crush injury arm, right, initial encounter    Rx / DC Orders ED Discharge Orders    None       Jeannie Fend, PA-C 10/11/20 1348  Virgina Norfolk, DO 10/11/20 1354

## 2020-10-11 NOTE — ED Notes (Signed)
Portable Xray at bedside.

## 2020-10-11 NOTE — ED Notes (Signed)
Pt dc home. Pt immediately removed hand splint staff had applied to right wrist. Reviewed use of hand splint. Melodye Ped

## 2020-10-11 NOTE — Discharge Instructions (Addendum)
Wear brace as needed for comfort.  You may remove to bathe. Take Motrin and Tylenol as needed as directed for pain.  You can apply ice to the arm for 20 minutes at a time and elevate to help with pain and swelling. Recommend recheck with your Worker's Comp. provider in the next 2 days.  Return to the emergency room for severe or concerning symptoms.

## 2020-11-25 ENCOUNTER — Ambulatory Visit (INDEPENDENT_AMBULATORY_CARE_PROVIDER_SITE_OTHER): Payer: BC Managed Care – PPO

## 2020-11-25 ENCOUNTER — Ambulatory Visit (INDEPENDENT_AMBULATORY_CARE_PROVIDER_SITE_OTHER): Payer: BC Managed Care – PPO | Admitting: Family Medicine

## 2020-11-25 ENCOUNTER — Encounter: Payer: Self-pay | Admitting: Family Medicine

## 2020-11-25 ENCOUNTER — Other Ambulatory Visit: Payer: Self-pay

## 2020-11-25 ENCOUNTER — Ambulatory Visit: Payer: Self-pay | Admitting: Family Medicine

## 2020-11-25 VITALS — BP 102/65 | HR 71 | Resp 16 | Wt 145.6 lb

## 2020-11-25 DIAGNOSIS — M25552 Pain in left hip: Secondary | ICD-10-CM

## 2020-11-25 MED ORDER — MELOXICAM 15 MG PO TABS: 15.0000 mg | ORAL_TABLET | Freq: Every day | ORAL | 0 refills | Status: DC

## 2020-11-25 NOTE — Progress Notes (Signed)
Acute Office Visit  Subjective:    Patient ID: Adam Norton, male    DOB: 07/24/1975, 45 y.o.   MRN: [id number]  Chief Complaint  Patient presents with  . Groin Pain    HPI Patient is in today for left groin pain.  Patient states that on Saturday night, he was lying on the cough and when he tried to reposition he noticed some left groin pain. Denies any recent injuries, but reports he does do a decent amount lifting at work. By Sunday, the aching was more constant and he noticed some soreness into his upper thigh and pelvic area. Yesterday, when trying to get in and out of his truck, the lifting action caused sharp 8/10 pain. At rest he remains at 5/10 soreness/aching with no radiation. He denies any trouble with urination or bowel movements, abdominal pain, numbness, tingling, nausea, vomiting. No history of hernia. He has not noticed any bulging/changes to scrotum/testicles.     Past Medical History:  Diagnosis Date  . Kidney stone     Past Surgical History:  Procedure Laterality Date  . KIDNEY STONE SURGERY      Family History  Problem Relation Age of Onset  . Alcohol abuse Father   . Diabetes Father   . Cancer Father   . Alcohol abuse Brother     Social History   Socioeconomic History  . Marital status: Married    Spouse name: Not on file  . Number of children: Not on file  . Years of education: Not on file  . Highest education level: Not on file  Occupational History  . Not on file  Tobacco Use  . Smoking status: Current Every Day Smoker    Packs/day: 1.00  . Smokeless tobacco: Never Used  Vaping Use  . Vaping Use: Never used  Substance and Sexual Activity  . Alcohol use: Not Currently    Alcohol/week: 0.0 standard drinks  . Drug use: No  . Sexual activity: Not on file  Other Topics Concern  . Not on file  Social History Narrative  . Not on file   Social Determinants of Health   Financial Resource Strain: Not on file  Food Insecurity: Not on  file  Transportation Needs: Not on file  Physical Activity: Not on file  Stress: Not on file  Social Connections: Not on file  Intimate Partner Violence: Not on file    Outpatient Medications Prior to Visit  Medication Sig Dispense Refill  . albuterol (VENTOLIN HFA) 108 (90 Base) MCG/ACT inhaler Inhale 1-2 puffs into the lungs every 4 (four) hours as needed for wheezing or shortness of breath. 1 Inhaler 0  . fluticasone (FLONASE) 50 MCG/ACT nasal spray One spray in each nostril twice a day, use left hand for right nostril, and right hand for left nostril. 48 g 3  . loratadine (CLARITIN) 10 MG tablet Take 10 mg by mouth daily.    . sertraline (ZOLOFT) 100 MG tablet Take 1 tablet (100 mg total) by mouth daily. 90 tablet 3  . traZODone (DESYREL) 100 MG tablet Take 1 tablet (100 mg total) by mouth at bedtime. 90 tablet 3   No facility-administered medications prior to visit.    No Known Allergies  Review of Systems All review of systems negative except what is listed in the HPI     Objective:    Physical Exam Constitutional:      Appearance: Normal appearance. He is normal weight.  HENT:     Head:  Normocephalic and atraumatic.  Abdominal:     General: Abdomen is flat.     Palpations: Abdomen is soft. There is no mass.     Tenderness: There is no abdominal tenderness. There is no guarding.     Hernia: No hernia is present.  Genitourinary:    Penis: Normal.      Testes: Normal.     Comments: No clear hernia or abnormality visualized or palpated Musculoskeletal:        General: Tenderness present. No swelling, deformity or signs of injury.     Left lower leg: No edema.     Comments: Tenderness in left groin over pelvic bone and hip adductor. Pain reproducible with hip flexion and adduction straight leg raise  Skin:    General: Skin is warm and dry.     Findings: No erythema, lesion or rash.  Neurological:     General: No focal deficit present.     Mental Status: He is alert  and oriented to person, place, and time.  Psychiatric:        Mood and Affect: Mood normal.        Behavior: Behavior normal.        Thought Content: Thought content normal.        Judgment: Judgment normal.     BP 102/65   Pulse 71   Resp 16   Wt 145 lb 9.6 oz (66 kg)   SpO2 99%   BMI 22.14 kg/m  Wt Readings from Last 3 Encounters:  11/25/20 145 lb 9.6 oz (66 kg)  10/11/20 157 lb (71.2 kg)  05/28/20 171 lb 14.4 oz (78 kg)    Health Maintenance Due  Topic Date Due  . Hepatitis C Screening  Never done  . COLONOSCOPY (Pts 45-33yrs Insurance coverage will need to be confirmed)  Never done    There are no preventive care reminders to display for this patient.   Lab Results  Component Value Date   TSH 0.437 08/22/2015   Lab Results  Component Value Date   WBC 11.6 (H) 11/20/2018   HGB 13.1 (L) 11/20/2018   HCT 40.6 11/20/2018   MCV 80.9 11/20/2018   PLT 487 (H) 11/20/2018   Lab Results  Component Value Date   NA 137 11/20/2018   K 4.2 11/20/2018   CO2 22 11/20/2018   GLUCOSE 100 (H) 11/20/2018   BUN 14 11/20/2018   CREATININE 0.77 11/20/2018   BILITOT 0.2 11/20/2018   ALKPHOS 93 08/27/2016   AST 11 11/20/2018   ALT 15 11/20/2018   PROT 7.0 11/20/2018   ALBUMIN 4.3 08/27/2016   CALCIUM 9.5 11/20/2018   Lab Results  Component Value Date   CHOL 217 (H) 08/27/2016   Lab Results  Component Value Date   HDL 35 (L) 08/27/2016   Lab Results  Component Value Date   LDLCALC 159 (H) 08/27/2016   Lab Results  Component Value Date   TRIG 117 08/27/2016   Lab Results  Component Value Date   CHOLHDL 6.2 (H) 08/27/2016   No results found for: HGBA1C     Assessment & Plan:   Problem List Items Addressed This Visit   None   Visit Diagnoses    Hip pain, acute, left    -  Primary   Relevant Medications   meloxicam (MOBIC) 15 MG tablet   Other Relevant Orders   DG HIP UNILAT W OR W/O PELVIS 2-3 VIEWS LEFT    Treating for adductor strain and  getting  hip/pelvis x-ray to confirm no other acute concerns. No obvious palpable hernia, but patient was educated on signs and symptoms that would require further evaluation. Starting conservatively with meloxicam and stretches (handout provided). Light duty for the next 2 weeks to allow for healing. Follow-up in 4 weeks or sooner if needed.    Meds ordered this encounter  Medications  . meloxicam (MOBIC) 15 MG tablet    Sig: Take 1 tablet (15 mg total) by mouth daily.    Dispense:  30 tablet    Refill:  0     Clayborne Dana, NP

## 2020-11-26 ENCOUNTER — Telehealth: Payer: Self-pay

## 2020-11-26 NOTE — Telephone Encounter (Signed)
Patient has been advised. Waiting for official Radiology reading

## 2020-11-26 NOTE — Telephone Encounter (Signed)
Patient called for xray results. Please advise

## 2020-11-26 NOTE — Telephone Encounter (Signed)
I don't see any obvious abnormalities on the xray.  Still waiting to be read by radiologist.

## 2020-11-27 NOTE — Progress Notes (Signed)
Your hip/pelvis x-ray was normal. Continue the plan we discussed and follow-up as needed.

## 2020-12-09 ENCOUNTER — Ambulatory Visit: Payer: BC Managed Care – PPO | Admitting: Family Medicine

## 2020-12-09 ENCOUNTER — Encounter: Payer: Self-pay | Admitting: Family Medicine

## 2020-12-09 ENCOUNTER — Other Ambulatory Visit: Payer: Self-pay

## 2020-12-09 VITALS — BP 108/69 | HR 79 | Ht 68.0 in | Wt 148.0 lb

## 2020-12-09 DIAGNOSIS — G8929 Other chronic pain: Secondary | ICD-10-CM | POA: Insufficient documentation

## 2020-12-09 DIAGNOSIS — S76212A Strain of adductor muscle, fascia and tendon of left thigh, initial encounter: Secondary | ICD-10-CM | POA: Diagnosis not present

## 2020-12-09 DIAGNOSIS — S76212D Strain of adductor muscle, fascia and tendon of left thigh, subsequent encounter: Secondary | ICD-10-CM

## 2020-12-09 DIAGNOSIS — S76219A Strain of adductor muscle, fascia and tendon of unspecified thigh, initial encounter: Secondary | ICD-10-CM | POA: Insufficient documentation

## 2020-12-09 DIAGNOSIS — M25552 Pain in left hip: Secondary | ICD-10-CM | POA: Insufficient documentation

## 2020-12-09 MED ORDER — PREDNISONE 50 MG PO TABS: ORAL_TABLET | ORAL | 0 refills | Status: DC

## 2020-12-09 NOTE — Progress Notes (Signed)
Adam Norton - 45 y.o. male MRN [id number]  Date of birth: [birth year]  Subjective Chief Complaint  Patient presents with  . Hip Pain    HPI Adam Norton is a 45 y.o. male here today for follow up of hip/groin pain.  This started a couple weeks ago.  Reports that he shifted while sitting and felt a pop and then pain.  Standing seems to make this worse but he does have pain with sitting and movement such as bending and squatting.  He does feel like there is some radiation down the back of the leg at time.  He denies weakness of the leg.  Recent xrays were unremarkable. He has tried meloxicam without much improvement.    ROS:  A comprehensive ROS was completed and negative except as noted per HPI  No Known Allergies  Past Medical History:  Diagnosis Date  . Kidney stone     Past Surgical History:  Procedure Laterality Date  . KIDNEY STONE SURGERY      Social History   Socioeconomic History  . Marital status: Married    Spouse name: Not on file  . Number of children: Not on file  . Years of education: Not on file  . Highest education level: Not on file  Occupational History  . Not on file  Tobacco Use  . Smoking status: Current Every Day Smoker    Packs/day: 1.00  . Smokeless tobacco: Never Used  Vaping Use  . Vaping Use: Never used  Substance and Sexual Activity  . Alcohol use: Not Currently    Alcohol/week: 0.0 standard drinks  . Drug use: No  . Sexual activity: Not on file  Other Topics Concern  . Not on file  Social History Narrative  . Not on file   Social Determinants of Health   Financial Resource Strain: Not on file  Food Insecurity: Not on file  Transportation Needs: Not on file  Physical Activity: Not on file  Stress: Not on file  Social Connections: Not on file    Family History  Problem Relation Age of Onset  . Alcohol abuse Father   . Diabetes Father   . Cancer Father   . Alcohol abuse Brother     Health Maintenance  Topic Date Due  .  Hepatitis C Screening  Never done  . COLONOSCOPY (Pts 45-68yrs Insurance coverage will need to be confirmed)  Never done  . COVID-19 Vaccine (1) 06/13/2021 (Originally 08/28/1978)  . INFLUENZA VACCINE  02/16/2021  . TETANUS/TDAP  09/28/2026  . HIV Screening  Completed  . HPV VACCINES  Aged Out     ----------------------------------------------------------------------------------------------------------------------------------------------------------------------------------------------------------------- Physical Exam BP 108/69 (BP Location: Left Arm, Patient Position: Sitting, Cuff Size: Normal)   Pulse 79   Ht 5\' 8"  (1.727 m)   Wt 148 lb (67.1 kg)   SpO2 96%   BMI 22.50 kg/m   Physical Exam Constitutional:      Appearance: Normal appearance.  HENT:     Head: Normocephalic and atraumatic.  Eyes:     General: No scleral icterus. Cardiovascular:     Rate and Rhythm: Normal rate and regular rhythm.  Pulmonary:     Effort: Pulmonary effort is normal.     Breath sounds: Normal breath sounds.  Musculoskeletal:     Cervical back: Neck supple.     Comments: ROM of hip painful with internal rotation. Pain with hip flexion. SLR with mild pain.  Strength is normal.  TTP along adductors of groin and lower  buttock area.    Neurological:     General: No focal deficit present.     Mental Status: He is alert.  Psychiatric:        Mood and Affect: Mood normal.        Behavior: Behavior normal.     ------------------------------------------------------------------------------------------------------------------------------------------------------------------------------------------------------------------- Assessment and Plan  Groin strain Symptoms seem consistent with groin strain.  Adding course of prednisone as well as ordering physical therapy.  If not improving we can consider MRI of the hip to evaluate for labral pathology.     Meds ordered this encounter  Medications  .  predniSONE (DELTASONE) 50 MG tablet    Sig: Take 50mg  po x6 days    Dispense:  6 tablet    Refill:  0    No follow-ups on file.    This visit occurred during the SARS-CoV-2 public health emergency.  Safety protocols were in place, including screening questions prior to the visit, additional usage of staff PPE, and extensive cleaning of exam room while observing appropriate contact time as indicated for disinfecting solutions.

## 2020-12-09 NOTE — Assessment & Plan Note (Signed)
Symptoms seem consistent with groin strain.  Adding course of prednisone as well as ordering physical therapy.  If not improving we can consider MRI of the hip to evaluate for labral pathology.

## 2020-12-10 ENCOUNTER — Telehealth: Payer: Self-pay

## 2020-12-10 NOTE — Telephone Encounter (Signed)
Pt stopped by to give [place] his FMLA paperwork.Marland Kitchen He asked if I could give this to Dr. Ashley Royalty and have him fill this form out. Please let him know when it's completed to pick the paperwork up.Can we kindly fax it over for him as well .Paperwork was placed in the message box- tvt

## 2020-12-11 ENCOUNTER — Ambulatory Visit (INDEPENDENT_AMBULATORY_CARE_PROVIDER_SITE_OTHER): Payer: BC Managed Care – PPO | Admitting: Rehabilitative and Restorative Service Providers"

## 2020-12-11 ENCOUNTER — Other Ambulatory Visit: Payer: Self-pay

## 2020-12-11 ENCOUNTER — Encounter: Payer: Self-pay | Admitting: Rehabilitative and Restorative Service Providers"

## 2020-12-11 DIAGNOSIS — R29898 Other symptoms and signs involving the musculoskeletal system: Secondary | ICD-10-CM | POA: Diagnosis not present

## 2020-12-11 DIAGNOSIS — M79652 Pain in left thigh: Secondary | ICD-10-CM | POA: Diagnosis not present

## 2020-12-11 NOTE — Therapy (Signed)
[facility] [place] [address] [place], [place], [address] Phone: [phone]   Fax:  [phone]  Physical Therapy Evaluation  Patient Details  Name: Adam Norton MRN: [id number] Date of Birth: March 05, 1976 Referring Provider (PT): Dr Everrett Coombe   Encounter Date: 12/11/2020   PT End of Session - 12/11/20 1629    Visit Number 1    Number of Visits 12    Date for PT Re-Evaluation 01/22/21    PT Start Time 1535    PT Stop Time 1630    PT Time Calculation (min) 55 min    Activity Tolerance Patient tolerated treatment well           Past Medical History:  Diagnosis Date  . Kidney stone     Past Surgical History:  Procedure Laterality Date  . KIDNEY STONE SURGERY      There were no vitals filed for this visit.    Subjective Assessment - 12/11/20 1535    Subjective Patient reports that he has had pain in the Lt groin while sitting on the sofa about two weeks ago which increased the following day when he was at work. Pain has continued since that time with variable intensity.    Pertinent History unremarkable medical history; anxiety; depression    Patient Stated Goals get rid og the groin pain    Currently in Pain? Yes    Pain Score 3     Pain Location Groin    Pain Orientation Left    Pain Descriptors / Indicators Discomfort    Pain Type Acute pain    Pain Radiating Towards toward belly    Pain Onset 1 to 4 weeks ago    Pain Frequency Intermittent    Aggravating Factors  everything    Pain Relieving Factors medicine; rest              [facility] PT Assessment - 12/11/20 0001      Assessment   Medical Diagnosis Inguinal strain    Referring Provider (PT) Dr Everrett Coombe    Onset Date/Surgical Date 11/27/20    Hand Dominance Right    Next MD Visit 12/23/20    Prior Therapy here x 3 visits for shoulder      Precautions   Precautions None      Balance Screen   Has the patient fallen in the past 6 months No     Has the patient had a decrease in activity level because of a fear of falling?  No    Is the patient reluctant to leave their home because of a fear of falling?  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 - lifting ~ about 1 or more times/minute squatting twisting moving onto a pallet for 3 months    Leisure yard work house chores; dog care and walking dog      Observation/Other Assessments   Focus on Therapeutic Outcomes (FOTO)  30      Sensation   Additional Comments some tingling at times      Posture/Postural Control   Posture Comments head forward; shoulders rounded; wt shifted to the Rt      AROM   Lumbar Flexion 65% pulling posterior hip to calf    Lumbar Extension 20%    Lumbar - Right Side Bend  60% pulling Lt groin    Lumbar - Left Side Bend 65% discomfort Lt groin    Lumbar - Right Rotation 20% pulling Lt groin    Lumbar - Left Rotation 20% pulling Lt groin      Strength   Overall Strength Comments WFL's bilat LE's      Flexibility   Hamstrings tight Lt > Rt    Quadriceps tight Lt > Rt    ITB tight Lt > Rt    Piriformis tight Lt > Rt pain into the Lt groin with stretch on Lt      Palpation   Palpation comment muscular tightness Lt adductors and hip flexors      Ambulation/Gait   Gait Comments limp Lt LE with wt bearing Lt                      Objective measurements completed on examination: See above findings.       [facility] Adult PT Treatment/Exercise - 12/11/20 0001      Knee/Hip Exercises: Stretches   Aeronautical engineer reps;30 seconds   some discomfort held at 2 reps   Other Knee/Hip Stretches hip adductor stretch butterfly 30 sec x 3 reps pt assisting to return Lt LE to neutral; hip adductor stretch with strap 30 sec x 3      Moist Heat Therapy   Number Minutes Moist Heat 10 Minutes    Moist Heat  Location Hip;Other (comment)   proximal inner thigh     Electrical Stimulation   Electrical Stimulation Location Lt hip adductors    Electrical Stimulation Action TENS    Electrical Stimulation Parameters to tolerance    Electrical Stimulation Goals Pain;Tone      Manual Therapy   Manual Therapy Soft tissue mobilization;Myofascial release    Manual therapy comments pt supine hooklying    Soft tissue mobilization deep tissue work through the [employer]                  PT Education - 12/11/20 1619    Education Details HEP POC TENS DN    Person(s) Educated Patient    Methods Explanation;Demonstration;Tactile cues;Verbal cues;Handout    Comprehension Verbalized understanding;Returned demonstration;Verbal cues required;Tactile cues required               PT Long Term Goals - 12/11/20 1637      PT LONG TERM GOAL #1   Title Decrease pain with functional activities and ambulation to 2/01 to 0/10    Time 6    Period Weeks    Status New    Target Date 01/22/21      PT LONG TERM GOAL #2   Title Decrease tightness to palpation through Lt adductors and hip flexors    Time 6    Period Weeks    Status New    Target Date 01/22/21      PT LONG TERM GOAL #3   Title Ergonomic assessment of work tasks to allow safe return to work    Time 6    Period Weeks    Status New    Target Date 01/22/21      PT LONG TERM GOAL #4   Title Independent in HEP    Time 6    Period Weeks    Status New    Target Date 01/22/21      PT LONG TERM GOAL #5   Title Improve functional limitation score to 59  Time 6    Period Weeks    Status New    Target Date 01/22/21                  Plan - 12/11/20 1630    Clinical Impression Statement Patient presents with ~ 2 week history of Lt groin pain with no known injury. He has antalgic gait; pain with functional activities; limited trunk and LE mobility and ROM; muscular tightness to palpation through hip adductors. Patient will  benefit from PT to address problems identified.    Stability/Clinical Decision Making Stable/Uncomplicated    Clinical Decision Making Low    Rehab Potential Good    PT Frequency 2x / week    PT Duration 6 weeks    PT Treatment/Interventions ADLs/Self Care Home Management;Aquatic Therapy;Cryotherapy;Electrical Stimulation;Iontophoresis 4mg /ml Dexamethasone;Moist Heat;Ultrasound;Therapeutic activities;Therapeutic exercise;Balance training;Neuromuscular re-education;Manual techniques;Dry needling;Taping    PT Next Visit Plan review HEP; trial of DN vs STM; progress with stretching for Lt hip/LE; progress with strengthening; and assess work activities to assure return to work safely as indicated    PT Home Exercise Plan [email or web address]    Consulted and Agree with Plan of Care Patient           Patient will benefit from skilled therapeutic intervention in order to improve the following deficits and impairments:  Abnormal gait,Increased fascial restricitons,Decreased activity tolerance,Pain,Impaired flexibility,Improper body mechanics,Decreased mobility,Decreased strength,Postural dysfunction  Visit Diagnosis: Other symptoms and signs involving the musculoskeletal system  Pain in left thigh     Problem List Patient Active Problem List   Diagnosis Date Noted  . Groin strain 12/09/2020  . Vaccine counseling 05/28/2020  . Wheezing 07/05/2018  . Umbilical hernia 11/29/2017  . Cannabis abuse 07/03/2017  . Poor dentition 05/20/2017  . Nicotine dependence 04/12/2017  . Nephrolithiasis 11/04/2016  . Vitamin D deficiency 08/23/2016  . Arthropathy of cervical facet joint 01/13/2016  . Insomnia 08/25/2015  . Generalized anxiety disorder 08/18/2015  . Major depression in complete remission (HCC) 08/18/2015  . Depression 08/18/2015    Shemia Bevel 08/20/2015 PT, MPH  12/11/2020, 4:42 PM  [facility] [address] [place], [place], [place] Phone:  [phone]   Fax:  [phone]  Name: Adam Norton MRN: Dorthula Rue Date of Birth: April 27, 1976

## 2020-12-11 NOTE — Patient Instructions (Signed)
Access Code: [email or web address]: [email or web address]: 05/26/2022Prepared by: Makena Murdock HoltExercises  Supine Hip Adductor Stretch - 2 x daily - 7 x weekly - 1 sets - 3 reps - 30 sec hold  Hip Adductors and Hamstring Stretch with Strap - 2 x daily - 7 x weekly - 1 sets - 3 reps - 30 sec hold  Prone Quadriceps Stretch with Strap - 2 x daily - 7 x weekly - 1 sets - 3 reps - 30 sec hold Patient Education  TENS Unit  Trigger Point Dry Needling

## 2020-12-12 NOTE — Telephone Encounter (Signed)
Forms completed and placed in Adam Norton's box

## 2020-12-16 NOTE — Telephone Encounter (Signed)
Forms faxed and pt notified

## 2020-12-17 ENCOUNTER — Encounter: Payer: Self-pay | Admitting: Rehabilitative and Restorative Service Providers"

## 2020-12-17 ENCOUNTER — Ambulatory Visit: Payer: BC Managed Care – PPO | Admitting: Rehabilitative and Restorative Service Providers"

## 2020-12-17 ENCOUNTER — Other Ambulatory Visit: Payer: Self-pay

## 2020-12-17 DIAGNOSIS — M79652 Pain in left thigh: Secondary | ICD-10-CM | POA: Diagnosis not present

## 2020-12-17 DIAGNOSIS — R29898 Other symptoms and signs involving the musculoskeletal system: Secondary | ICD-10-CM

## 2020-12-17 NOTE — Therapy (Signed)
[facility] [place] [address] [place], [place], [address] Phone: [phone]   Fax:  [phone]  Physical Therapy Treatment  Patient Details  Name: Adam Norton MRN: [id number] Date of Birth: [birth year] Referring Provider (PT): Dr Everrett Coombe   Encounter Date: 12/17/2020   PT End of Session - 12/17/20 0912    Visit Number 2    Number of Visits 12    Date for PT Re-Evaluation 01/22/21    PT Start Time 0913    PT Stop Time 1002    PT Time Calculation (min) 49 min    Activity Tolerance Patient tolerated treatment well           Past Medical History:  Diagnosis Date  . Kidney stone     Past Surgical History:  Procedure Laterality Date  . KIDNEY STONE SURGERY      There were no vitals filed for this visit.   Subjective Assessment - 12/17/20 0914    Subjective Patient reports that he has good days and not as good days. Exercises are going okay. Has done some exercises and swimming in the pool yesterday. Driving seems to hurt. Pain is variable in intensity - from 0/10 up to 6/10.    Currently in Pain? Yes    Pain Score 3     Pain Location Groin    Pain Orientation Left    Pain Descriptors / Indicators Discomfort    Pain Type Acute pain    Pain Onset 1 to 4 weeks ago    Pain Frequency Intermittent    Aggravating Factors  activities - everything    Pain Relieving Factors medicine; exercises              [facility] PT Assessment - 12/17/20 0001      Assessment   Medical Diagnosis Inguinal strain    Referring Provider (PT) Dr Everrett Coombe    Onset Date/Surgical Date 11/27/20    Hand Dominance Right    Next MD Visit 12/23/20    Prior Therapy here x 3 visits for shoulder      Palpation   Palpation comment decreasing muscular tightness Lt adductors and hip flexors                         [facility] Adult PT Treatment/Exercise - 12/17/20 0001      Knee/Hip Exercises: Stretches   Passive Hamstring  Stretch Left;3 reps;30 seconds   supine with strap   Quad Stretch Left;2 reps;30 seconds   some discomfort held at 2 reps   Hip Flexor Stretch Right;Left;2 reps;30 seconds   seated   Other Knee/Hip Stretches hip adductor stretch butterfly 30 sec x 3 reps pt assisting to return Lt LE to neutral; hip adductor stretch with strap 30 sec x 3      Knee/Hip Exercises: Aerobic   Other Aerobic walking post exercises x 2 laps in gym      Knee/Hip Exercises: Standing   Hip Abduction AROM;Stengthening;Right;Left;2 sets;10 reps;Knee straight    Hip Extension AROM;Stengthening;Right;Left;2 sets;10 reps;Knee straight      Knee/Hip Exercises: Supine   Other Supine Knee/Hip Exercises trial of active hip abduction in hooklying with TB then without TB - both created pain in the hip adductors      Knee/Hip Exercises: Sidelying   Hip ABduction AROM;Left;5 reps   2-3 sec hold     Modalities   Modalities --   No charge for E-Stim  Moist Heat Therapy   Number Minutes Moist Heat 10 Minutes    Moist Heat Location Hip;Other (comment)   proximal inner thigh - hip adductors(pt placing electrodes)     Electrical Stimulation   Electrical Stimulation Location Lt hip adductors    Electrical Stimulation Action TENS    Electrical Stimulation Parameters to tolerance    Electrical Stimulation Goals Pain;Tone      Manual Therapy   Manual therapy comments skilled palpation to assess response to DN and manual work    Soft tissue mobilization deep tissue work through the Lt adductors - TP release to adductor attachment at pubic bone    Passive ROM passive hip abduction 45-60 sec hold x 3 reps            Trigger Point Dry Needling - 12/17/20 0001    Consent Given? Yes    Education Handout Provided Yes    Other Dry Needling Lt    Adductor Response Palpable increased muscle length                PT Education - 12/17/20 0941    Education Details HEP DN    Person(s) Educated Patient    Methods  Explanation;Demonstration;Tactile cues;Verbal cues;Handout    Comprehension Verbalized understanding;Returned demonstration;Verbal cues required;Tactile cues required               PT Long Term Goals - 12/11/20 1637      PT LONG TERM GOAL #1   Title Decrease pain with functional activities and ambulation to 2/01 to 0/10    Time 6    Period Weeks    Status New    Target Date 01/22/21      PT LONG TERM GOAL #2   Title Decrease tightness to palpation through Lt adductors and hip flexors    Time 6    Period Weeks    Status New    Target Date 01/22/21      PT LONG TERM GOAL #3   Title Ergonomic assessment of work tasks to allow safe return to work    Time 6    Period Weeks    Status New    Target Date 01/22/21      PT LONG TERM GOAL #4   Title Independent in HEP    Time 6    Period Weeks    Status New    Target Date 01/22/21      PT LONG TERM GOAL #5   Title Improve functional limitation score to 59    Time 6    Period Weeks    Status New    Target Date 01/22/21                 Plan - 12/17/20 0913    Clinical Impression Statement Some improvement in muscular tightness Lt adductors. Tolerated DN and manual work Lt hip adductors. Added stretches and active exercise for antagonists. Patient tolerated treatment well. Returns to MD 12/23/20. Note to MD at next visit.    Rehab Potential Good    PT Frequency 2x / week    PT Duration 6 weeks    PT Treatment/Interventions ADLs/Self Care Home Management;Aquatic Therapy;Cryotherapy;Electrical Stimulation;Iontophoresis 4mg /ml Dexamethasone;Moist Heat;Ultrasound;Therapeutic activities;Therapeutic exercise;Balance training;Neuromuscular re-education;Manual techniques;Dry needling;Taping    PT Next Visit Plan review HEP; assess response to trial of DN vs STM; progress with stretching for Lt hip/LE as tolerated; progress with strengthening; and assess work activities to assure return to work safely as indicated - note to  MD     PT Home Exercise Plan [email or web address]    Recommended Other Services DO NOT BILL FOR E-STIM    Consulted and Agree with Plan of Care Patient           Patient will benefit from skilled therapeutic intervention in order to improve the following deficits and impairments:     Visit Diagnosis: Other symptoms and signs involving the musculoskeletal system  Pain in left thigh     Problem List Patient Active Problem List   Diagnosis Date Noted  . Groin strain 12/09/2020  . Vaccine counseling 05/28/2020  . Wheezing 07/05/2018  . Umbilical hernia 11/29/2017  . Cannabis abuse 07/03/2017  . Poor dentition 05/20/2017  . Nicotine dependence 04/12/2017  . Nephrolithiasis 11/04/2016  . Vitamin D deficiency 08/23/2016  . Arthropathy of cervical facet joint 01/13/2016  . Insomnia 08/25/2015  . Generalized anxiety disorder 08/18/2015  . Major depression in complete remission (HCC) 08/18/2015  . Depression 08/18/2015    Issaiah Seabrooks Rober Minion PT, MPH  12/17/2020, 10:15 AM  [facility] [address] [place], [place], [address] Phone: [phone]   Fax:  [phone]  Name: Adam Norton MRN: [id number] Date of Birth: [birth year]

## 2020-12-17 NOTE — Patient Instructions (Addendum)
Access Code: [email or web address]: [email or web address]: 06/01/2022Prepared by: Tiawanna Luchsinger HoltExercises  Supine Hip Adductor Stretch - 2 x daily - 7 x weekly - 1 sets - 3 reps - 30 sec hold  Hip Adductors and Hamstring Stretch with Strap - 2 x daily - 7 x weekly - 1 sets - 3 reps - 30 sec hold  Prone Quadriceps Stretch with Strap - 2 x daily - 7 x weekly - 1 sets - 3 reps - 30 sec hold  Hooklying Hamstring Stretch with Strap - 2 x daily - 7 x weekly - 1 sets - 3 reps - 30 sec hold  Sidelying Hip Abduction - 2 x daily - 7 x weekly - 2-3 sets - 10 reps - 3-5 sec hold  Standing Hip Abduction with Counter Support - 2 x daily - 7 x weekly - 2-3 sets - 10 reps - 2-3 sec hold  Standing Hip Extension with Counter Support - 2 x daily - 7 x weekly - 1-2 sets - 10 reps - 3-5 sec hold  Hip Abduction and Adduction Caregiver PROM - 2 x daily - 7 x weekly - 1 sets - 3 reps - 30 sec hold   o  Trigger Point Dry Needling  . What is Trigger Point Dry Needling (DN)? o DN is a physical therapy technique used to treat muscle pain and dysfunction. Specifically, DN helps deactivate muscle trigger points (muscle knots).  o A thin filiform needle is used to penetrate the skin and stimulate the underlying trigger point. The goal is for a local twitch response (LTR) to occur and for the trigger point to relax. No medication of any kind is injected during the procedure.   . What Does Trigger Point Dry Needling Feel Like?  o The procedure feels different for each individual patient. Some patients report that they do not actually feel the needle enter the skin and overall the process is not painful. Very mild bleeding may occur. However, many patients feel a deep cramping in the muscle in which the needle was inserted. This is the local twitch response.   Marland Kitchen How Will I feel after the treatment? o Soreness is normal, and the onset of soreness may not occur for a few hours. Typically this soreness does not last longer  than two days.  o Bruising is uncommon, however; ice can be used to decrease any possible bruising.  o In rare cases feeling tired or nauseous after the treatment is normal. In addition, your symptoms may get worse before they get better, this period will typically not last longer than 24 hours.   . What Can I do After My Treatment? o Increase your hydration by drinking more water for the next 24 hours. o You may place ice or heat on the areas treated that have become sore, however, do not use heat on inflamed or bruised areas. Heat often brings more relief post needling. o You can continue your regular activities, but vigorous activity is not recommended initially after the treatment for 24 hours. o DN is best combined with other physical therapy such as strengthening, stretching, and other therapies.

## 2020-12-19 ENCOUNTER — Encounter: Payer: BC Managed Care – PPO | Admitting: Rehabilitative and Restorative Service Providers"

## 2020-12-23 ENCOUNTER — Other Ambulatory Visit: Payer: Self-pay

## 2020-12-23 ENCOUNTER — Encounter: Payer: Self-pay | Admitting: Rehabilitative and Restorative Service Providers"

## 2020-12-23 ENCOUNTER — Ambulatory Visit (INDEPENDENT_AMBULATORY_CARE_PROVIDER_SITE_OTHER): Payer: BC Managed Care – PPO | Admitting: Rehabilitative and Restorative Service Providers"

## 2020-12-23 ENCOUNTER — Ambulatory Visit: Payer: BC Managed Care – PPO | Admitting: Family Medicine

## 2020-12-23 ENCOUNTER — Encounter: Payer: Self-pay | Admitting: Family Medicine

## 2020-12-23 VITALS — BP 111/71 | HR 78 | Temp 97.5°F | Ht 68.0 in | Wt 147.8 lb

## 2020-12-23 DIAGNOSIS — R29898 Other symptoms and signs involving the musculoskeletal system: Secondary | ICD-10-CM

## 2020-12-23 DIAGNOSIS — S76212D Strain of adductor muscle, fascia and tendon of left thigh, subsequent encounter: Secondary | ICD-10-CM | POA: Diagnosis not present

## 2020-12-23 DIAGNOSIS — R109 Unspecified abdominal pain: Secondary | ICD-10-CM

## 2020-12-23 DIAGNOSIS — Z87442 Personal history of urinary calculi: Secondary | ICD-10-CM

## 2020-12-23 DIAGNOSIS — M79652 Pain in left thigh: Secondary | ICD-10-CM

## 2020-12-23 LAB — POCT URINALYSIS DIP (CLINITEK)
Bilirubin, UA: NEGATIVE
Blood, UA: NEGATIVE
Glucose, UA: NEGATIVE mg/dL
Ketones, POC UA: NEGATIVE mg/dL
Leukocytes, UA: NEGATIVE
Nitrite, UA: NEGATIVE
POC PROTEIN,UA: NEGATIVE
Spec Grav, UA: 1.025 (ref 1.010–1.025)
Urobilinogen, UA: 0.2 E.U./dL
pH, UA: 7 (ref 5.0–8.0)

## 2020-12-23 NOTE — Patient Instructions (Signed)
I would recommend continuation of physical therapy for now.  If you need additional paperwork for intermittent time off for these appointments let me know.  I think you can return to work at this point.

## 2020-12-23 NOTE — Progress Notes (Signed)
Adam Norton - 45 y.o. male MRN [id number]  Date of birth: 05/08/1976  Subjective Chief Complaint  Patient presents with  . Flank Pain    HPI Adam Norton is a 45 y.o. male here today for follow up of groin pain.  He has had PT  x3 sessions.  Reports that pain has improved significantly.  PT notes reviewed and he needs to work on continued strengthening to protect this are.  He is able to lean, bend, squat and climb now wihotu difficulty.  He fee like he can return back to work.    Also having some intermittent lower abdominal pain.  History of kidney stone.  Denies urinary frequency/urgency, blood in his urine.   ROS:  A comprehensive ROS was completed and negative except as noted per HPI  No Known Allergies  Past Medical History:  Diagnosis Date  . Kidney stone     Past Surgical History:  Procedure Laterality Date  . KIDNEY STONE SURGERY      Social History   Socioeconomic History  . Marital status: Married    Spouse name: Not on file  . Number of children: Not on file  . Years of education: Not on file  . Highest education level: Not on file  Occupational History  . Not on file  Tobacco Use  . Smoking status: Current Every Day Smoker    Packs/day: 1.00  . Smokeless tobacco: Never Used  Vaping Use  . Vaping Use: Never used  Substance and Sexual Activity  . Alcohol use: Not Currently    Alcohol/week: 0.0 standard drinks  . Drug use: No  . Sexual activity: Not on file  Other Topics Concern  . Not on file  Social History Narrative  . Not on file   Social Determinants of Health   Financial Resource Strain: Not on file  Food Insecurity: Not on file  Transportation Needs: Not on file  Physical Activity: Not on file  Stress: Not on file  Social Connections: Not on file    Family History  Problem Relation Age of Onset  . Alcohol abuse Father   . Diabetes Father   . Cancer Father   . Alcohol abuse Brother     Health Maintenance  Topic Date Due  .  Pneumococcal Vaccine 88-2 Years old (1 of 2 - PPSV23) Never done  . Hepatitis C Screening  Never done  . COLONOSCOPY (Pts 45-62yrs Insurance coverage will need to be confirmed)  Never done  . COVID-19 Vaccine (1) 06/13/2021 (Originally 08/28/1978)  . INFLUENZA VACCINE  02/16/2021  . Zoster Vaccines- Shingrix (1 of 2) 08/29/2023  . TETANUS/TDAP  09/28/2026  . HIV Screening  Completed  . HPV VACCINES  Aged Out     ----------------------------------------------------------------------------------------------------------------------------------------------------------------------------------------------------------------- Physical Exam BP 111/71 (BP Location: Left Arm, Patient Position: Sitting, Cuff Size: Small)   Pulse 78   Temp (!) 97.5 F (36.4 C)   Ht 5\' 8"  (1.727 m)   Wt 147 lb 12.8 oz (67 kg)   SpO2 97%   BMI 22.47 kg/m   Physical Exam Constitutional:      Appearance: Normal appearance.  Cardiovascular:     Rate and Rhythm: Normal rate and regular rhythm.  Pulmonary:     Effort: Pulmonary effort is normal.     Breath sounds: Normal breath sounds.  Abdominal:     General: Abdomen is flat. There is no distension.     Tenderness: There is no abdominal tenderness. There is no right CVA tenderness  or left CVA tenderness.  Neurological:     General: No focal deficit present.     Mental Status: He is alert.  Psychiatric:        Mood and Affect: Mood normal.        Behavior: Behavior normal.     ------------------------------------------------------------------------------------------------------------------------------------------------------------------------------------------------------------------- Assessment and Plan  Groin strain Improved with prednisone and PT.  Recommend continuation of PT for strengthening. Checking UA for complaint of R flank ann RLQ pain but I think more likely related to activities form PT   No orders of the defined types were placed in this  encounter.   No follow-ups on file.    This visit occurred during the SARS-CoV-2 public health emergency.  Safety protocols were in place, including screening questions prior to the visit, additional usage of staff PPE, and extensive cleaning of exam room while observing appropriate contact time as indicated for disinfecting solutions.

## 2020-12-23 NOTE — Assessment & Plan Note (Signed)
Improved with prednisone and PT.  Recommend continuation of PT for strengthening. Checking UA for complaint of R flank ann RLQ pain but I think more likely related to activities form PT

## 2020-12-23 NOTE — Therapy (Signed)
[facility] [place] [address] [place], [place], [address] Phone: [phone]   Fax:  [phone]  Physical Therapy Treatment  Patient Details  Name: Adam Norton MRN: [id number] Date of Birth: [birth year] Referring Provider (PT): Dr Everrett Coombe   Encounter Date: 12/23/2020   PT End of Session - 12/23/20 0941    Visit Number 3    Number of Visits 12    Date for PT Re-Evaluation 01/22/21    PT Start Time 0939    PT Stop Time 1027   no charge for MH/estim   PT Time Calculation (min) 48 min    Activity Tolerance Patient tolerated treatment well           Past Medical History:  Diagnosis Date  . Kidney stone     Past Surgical History:  Procedure Laterality Date  . KIDNEY STONE SURGERY      There were no vitals filed for this visit.   Subjective Assessment - 12/23/20 0942    Subjective Patient reports that he is feeling better. He has no pain and has increased his activity level. He feels the dry needling helped a lot. He has been doing his stretches and has tried to increase activity and has been exercising in the water at his apt pool.    Currently in Pain? No/denies    Pain Score 0-No pain              [facility] PT Assessment - 12/23/20 0001      Assessment   Medical Diagnosis Inguinal strain    Referring Provider (PT) Dr Everrett Coombe    Onset Date/Surgical Date 11/27/20    Hand Dominance Right    Next MD Visit 12/23/20    Prior Therapy here x 3 visits for shoulder      AROM   Lumbar Flexion 80% pulling posterior thigh    Lumbar Extension 35%    Lumbar - Right Side Bend 60%    Lumbar - Left Side Bend 60%    Lumbar - Right Rotation 35%    Lumbar - Left Rotation 35%      Flexibility   Soft Tissue Assessment /Muscle Length --   tight through Lt hip flexors   Hamstrings tight Lt > Rt    Quadriceps tight Lt > Rt    ITB tight Lt > Rt    Piriformis tight Lt > Rt minimal pain into the Lt groin       Palpation   Palpation comment decreasing muscular tightness Lt adductors and hip flexors tightness now more concentrated to muscle origin      Ambulation/Gait   Gait Comments normalizing gait pattern                         [facility] Adult PT Treatment/Exercise - 12/23/20 0001      Knee/Hip Exercises: Stretches   Passive Hamstring Stretch Left;3 reps;30 seconds   supine with strap   Quad Stretch Left;2 reps;30 seconds   some discomfort held at 2 reps   Hip Flexor Stretch Right;Left;2 reps;30 seconds   seated   Other Knee/Hip Stretches hip adductor stretch butterfly 30 sec x 3 reps pt assisting to return Lt LE to neutral; hip adductor stretch with strap 30 sec x 3      Knee/Hip Exercises: Aerobic   Nustep L 7-5 x 4 min      Knee/Hip Exercises: Standing   Hip Abduction AROM;Stengthening;Right;Left;2 sets;10 reps;Knee  straight    Abduction Limitations red TB above knees    Hip Extension AROM;Stengthening;Right;Left;2 sets;10 reps;Knee straight    Extension Limitations red TB above knees    Wall Squat 10 reps;5 seconds   red TB above knees     Knee/Hip Exercises: Supine   Bridges Strengthening;Both;10 reps    Other Supine Knee/Hip Exercises trial of active hip abduction in hooklying with blue then red TB - both created pain in the hip adductors tolerated Active hip abduction alternating LE's      Moist Heat Therapy   Number Minutes Moist Heat 10 Minutes    Moist Heat Location Hip;Other (comment)   proximal inner thigh - hip adductors(pt placing electrodes)     Electrical Stimulation   Electrical Stimulation Location Lt hip adductors    Electrical Stimulation Action TENS    Electrical Stimulation Parameters to tolerance    Electrical Stimulation Goals Pain;Tone      Manual Therapy   Manual therapy comments skilled palpation to assess response to DN and manual work    Soft tissue mobilization deep tissue work through the Lt adductors - TP release to adductor attachment  at pubic bone    Passive ROM passive hip abduction 45-60 sec hold x 3 reps            Trigger Point Dry Needling - 12/23/20 0001    Consent Given? Yes    Education Handout Provided Previously provided    Other Dry Needling Lt    Adductor Response Palpable increased muscle length                     PT Long Term Goals - 12/23/20 1003      PT LONG TERM GOAL #1   Title Decrease pain with functional activities and ambulation to 2/01 to 0/10    Time 6    Period Weeks    Status Achieved      PT LONG TERM GOAL #2   Title Decrease tightness to palpation through Lt adductors and hip flexors    Time 6    Status On-going      PT LONG TERM GOAL #3   Title Ergonomic assessment of work tasks to allow safe return to work    Period Weeks    Status On-going      PT LONG TERM GOAL #4   Title Independent in HEP    Status On-going      PT LONG TERM GOAL #5   Title Improve functional limitation score to 59    Status On-going                 Plan - 12/23/20 1001    Clinical Impression Statement Good improvement in muscle strain. Patient presents today stating that he is pain free. He does have continued tightness in Lt hip adductors which have responded well to DN and manual work. Patient has started strengthening today which he was unable to tolerate last vist. He has been encouraged to continue with stretching and strengthening to prevent recurrent problems.    Rehab Potential Good    PT Frequency 2x / week    PT Duration 6 weeks    PT Treatment/Interventions ADLs/Self Care Home Management;Aquatic Therapy;Cryotherapy;Electrical Stimulation;Iontophoresis 4mg /ml Dexamethasone;Moist Heat;Ultrasound;Therapeutic activities;Therapeutic exercise;Balance training;Neuromuscular re-education;Manual techniques;Dry needling;Taping    PT Next Visit Plan review HEP; progress with stretching for Lt hip/LE as tolerated; progress with strengthening; and continue work activities to  assure return to work safely as  indicated    PT Home Exercise Plan [email or web address]    Consulted and Agree with Plan of Care Patient           Patient will benefit from skilled therapeutic intervention in order to improve the following deficits and impairments:     Visit Diagnosis: Other symptoms and signs involving the musculoskeletal system  Pain in left thigh     Problem List Patient Active Problem List   Diagnosis Date Noted  . Groin strain 12/09/2020  . Vaccine counseling 05/28/2020  . Wheezing 07/05/2018  . Umbilical hernia 11/29/2017  . Cannabis abuse 07/03/2017  . Poor dentition 05/20/2017  . Nicotine dependence 04/12/2017  . Nephrolithiasis 11/04/2016  . Vitamin D deficiency 08/23/2016  . Arthropathy of cervical facet joint 01/13/2016  . Insomnia 08/25/2015  . Generalized anxiety disorder 08/18/2015  . Major depression in complete remission (HCC) 08/18/2015  . Depression 08/18/2015    Gyan Cambre Rober Minion PT, MPH  12/23/2020, 10:26 AM  [facility] [address] [place], [place], [address] Phone: [phone]   Fax:  [phone]  Name: Adam Norton MRN: [id number] Date of Birth: [birth year]

## 2020-12-25 ENCOUNTER — Encounter: Payer: Self-pay | Admitting: Rehabilitative and Restorative Service Providers"

## 2020-12-25 ENCOUNTER — Other Ambulatory Visit: Payer: Self-pay

## 2020-12-25 ENCOUNTER — Ambulatory Visit (INDEPENDENT_AMBULATORY_CARE_PROVIDER_SITE_OTHER): Payer: BC Managed Care – PPO | Admitting: Rehabilitative and Restorative Service Providers"

## 2020-12-25 DIAGNOSIS — R29898 Other symptoms and signs involving the musculoskeletal system: Secondary | ICD-10-CM

## 2020-12-25 DIAGNOSIS — M79652 Pain in left thigh: Secondary | ICD-10-CM

## 2020-12-25 NOTE — Therapy (Addendum)
[place], [place], [address] Phone: [phone]   Fax:  [phone]  Physical Therapy Treatment  Patient Details  Name: Adam Norton MRN: [id number] Date of Birth: March 28, 1976 Referring Provider (PT): Dr Luetta Nutting   Encounter Date: 12/25/2020   PT End of Session - 12/25/20 0930     Visit Number 4    Number of Visits 12    Date for PT Re-Evaluation 01/22/21    PT Start Time 0930    PT Stop Time 6301   no charge for estim   PT Time Calculation (min) 48 min    Activity Tolerance Patient tolerated treatment well             Past Medical History:  Diagnosis Date   Kidney stone     Past Surgical History:  Procedure Laterality Date   KIDNEY STONE SURGERY      There were no vitals filed for this visit.   Subjective Assessment - 12/25/20 0931     Subjective Patient reports that MD was pleased with progress. He released Adam Norton to return to work regular duty. He started back to wrk yesterday and did pretty good. no significant increase in pain. Didn't do any stretching yesterday but did some stretching this morning.    Currently in Pain? No/denies    Pain Score 0-No pain    Pain Location Groin    Pain Orientation Left                [facility] PT Assessment - 12/25/20 0001       Assessment   Medical Diagnosis Inguinal strain    Referring Provider (PT) Dr Luetta Nutting    Onset Date/Surgical Date 11/27/20    Hand Dominance Right    Next MD Visit 12/23/20    Prior Therapy here x 3 visits for shoulder      Observation/Other Assessments   Focus on Therapeutic Outcomes (FOTO)  99      AROM   Lumbar Flexion 80% pulling posterior thigh    Lumbar Extension 35%    Lumbar - Right Side Bend 60%    Lumbar - Left Side Bend 60%    Lumbar - Right Rotation 35%    Lumbar - Left Rotation 35%      Flexibility   Soft Tissue Assessment /Muscle Length --   tight through Lt hip flexors    Hamstrings tight Lt > Rt    Quadriceps tight Lt > Rt    ITB tight Lt > Rt    Piriformis tight Lt > Rt minimal pain into the Lt groin      Palpation   Palpation comment decreasing muscular tightness Lt adductors and hip flexors tightness now more concentrated to muscle origin      Ambulation/Gait   Gait Comments normal gait pattern                           [facility] Adult PT Treatment/Exercise - 12/25/20 0001       Knee/Hip Exercises: Stretches   Passive Hamstring Stretch Left;3 reps;30 seconds   supine with strap   Quad Stretch Left;2 reps;30 seconds   some discomfort held at 2 reps   Hip Flexor Stretch Right;Left;2 reps;30 seconds   seated   Other Knee/Hip Stretches hip adductor stretch butterfly 30 sec x 3 reps pt assisting to return Lt LE to neutral; hip adductor stretch with strap 30 sec  x 3    Other Knee/Hip Stretches hip adductor stretch partial lunge to side 20-30 sec x 3 reps      Knee/Hip Exercises: Aerobic   Recumbent Bike L2 x 5 min      Knee/Hip Exercises: Standing   Hip Abduction AROM;Stengthening;Right;Left;2 sets;10 reps;Knee straight    Abduction Limitations red TB above knees    Hip Extension AROM;Stengthening;Right;Left;2 sets;10 reps;Knee straight    Extension Limitations red TB above knees    Wall Squat 10 reps;5 seconds   red TB above knees   Other Standing Knee Exercises standing leg swings      Knee/Hip Exercises: Supine   Bridges Strengthening;Both;10 reps    Other Supine Knee/Hip Exercises trial of active hip abduction in hooklying with yellowTB -  hip abduction alternating LE's x 10 each LE      Knee/Hip Exercises: Sidelying   Clams yellow TB x 10 Rt LE - x 5 no resistance Lt LE due to some "tug"      Moist Heat Therapy   Number Minutes Moist Heat 10 Minutes    Moist Heat Location Hip;Other (comment)   proximal inner thigh - hip adductors(pt placing electrodes)     Electrical Stimulation   Electrical Stimulation Location Lt hip  adductors    Electrical Stimulation Action TENS    Electrical Stimulation Parameters to tolerance    Electrical Stimulation Goals Pain;Tone      Manual Therapy   Soft tissue mobilization deep tissue work through the Lt adductors - TP release to adductor attachment at pubic bone    Passive ROM passive hip abduction 45-60 sec hold x 3 reps                    PT Education - 12/25/20 1024     Education Details HEP    Person(s) Educated Patient    Methods Explanation;Demonstration;Tactile cues;Verbal cues;Handout    Comprehension Verbalized understanding;Returned demonstration;Verbal cues required;Tactile cues required                 PT Long Term Goals - 12/23/20 1003       PT LONG TERM GOAL #1   Title Decrease pain with functional activities and ambulation to 2/01 to 0/10    Time 6    Period Weeks    Status Achieved      PT LONG TERM GOAL #2   Title Decrease tightness to palpation through Lt adductors and hip flexors    Time 6    Status On-going      PT LONG TERM GOAL #3   Title Ergonomic assessment of work tasks to allow safe return to work    Period Weeks    Status On-going      PT [place] #4   Title Independent in HEP    Status On-going      PT LONG TERM GOAL #5   Title Improve functional limitation score to 59    Status On-going                   Plan - 12/25/20 0936     Clinical Impression Statement Patient returned to work yesterday and did OK. He has continued tightness in hip abductors but no pain. Patient has accomplished most of PT goals and will continue with independent HEP. He will call with any questions or problems.    Rehab Potential Good    PT Frequency 2x / week    PT Duration 6  weeks    PT Treatment/Interventions ADLs/Self Care Home Management;Aquatic Therapy;Cryotherapy;Electrical Stimulation;Iontophoresis 29m/ml Dexamethasone;Moist Heat;Ultrasound;Therapeutic activities;Therapeutic exercise;Balance  training;Neuromuscular re-education;Manual techniques;Dry needling;Taping    PT Next Visit Plan patient will continue with independent HEP - returning to work and will hold PT for 4 weeks - he will call with any questions or problems.    PT Home Exercise Plan [email or web address]    Consulted and Agree with Plan of Care Patient             Patient will benefit from skilled therapeutic intervention in order to improve the following deficits and impairments:     Visit Diagnosis: Other symptoms and signs involving the musculoskeletal system  Pain in left thigh     Problem List Patient Active Problem List   Diagnosis Date Noted   Groin strain 12/09/2020   Vaccine counseling 05/28/2020   Wheezing 169/43/7005  Umbilical hernia 025/91/0289  Cannabis abuse 07/03/2017   Poor dentition 05/20/2017   Nicotine dependence 04/12/2017   Nephrolithiasis 11/04/2016   Vitamin D deficiency 08/23/2016   Arthropathy of cervical facet joint 01/13/2016   Insomnia 08/25/2015   Generalized anxiety disorder 08/18/2015   Major depression in complete remission ([place] 08/18/2015   Depression 08/18/2015    Zev Blue PNilda SimmerPT, MPH  12/25/2020, 10:28 AM  [facility] [place] [address] [address]Phone: [phone]  Fax:  [phone] Name: GGilmer KaminskyMRN: [id number] of Birth: 201/13/1977 PHYSICAL THERAPY DISCHARGE SUMMARY  Visits from Start of Care: 4  Current functional level related to goals / functional outcomes: See last progress note for discharge status    Remaining deficits: Unknown - doing well at last visit - returning to work   Education / Equipment: HEP    Patient agrees to discharge. Patient goals were partially met. Patient is being discharged due to being pleased with the current functional level.  Brendolyn Stockley P. HHelene KelpPT, MPH 01/14/21 11:46 AM

## 2020-12-25 NOTE — Patient Instructions (Signed)
Access Code: [email or web address]: [email or web address]: 06/09/2022Prepared by: Traylen Eckels HoltExercises  Supine Hip Adductor Stretch - 2 x daily - 7 x weekly - 1 sets - 3 reps - 30 sec hold  Hip Adductors and Hamstring Stretch with Strap - 2 x daily - 7 x weekly - 1 sets - 3 reps - 30 sec hold  Prone Quadriceps Stretch with Strap - 2 x daily - 7 x weekly - 1 sets - 3 reps - 30 sec hold  Hooklying Hamstring Stretch with Strap - 2 x daily - 7 x weekly - 1 sets - 3 reps - 30 sec hold  Sidelying Hip Abduction - 2 x daily - 7 x weekly - 2-3 sets - 10 reps - 3-5 sec hold  Standing Hip Abduction with Counter Support - 2 x daily - 7 x weekly - 2-3 sets - 10 reps - 2-3 sec hold  Standing Hip Extension with Counter Support - 2 x daily - 7 x weekly - 1-2 sets - 10 reps - 3-5 sec hold  Hip Abduction and Adduction Caregiver PROM - 2 x daily - 7 x weekly - 1 sets - 3 reps - 30 sec hold  Supine Bridge with Resistance Band - 2 x daily - 7 x weekly - 1-2 sets - 10 reps - 5-10 sec hold  Hooklying Isometric Clamshell - 2 x daily - 7 x weekly - 1 sets - 10 reps - 3 sec hold  Standing Hip Abduction with Resistance at Ankles and Counter Support - 1 x daily - 7 x weekly - 2-3 sets - 10 reps - 3 sec hold  Hip Extension with Resistance Loop - 1 x daily - 7 x weekly - 2-3 sets - 10 reps - 3 sec hold  Wall Squat with Swiss Ball and Resistance Loop - 1 x daily - 7 x weekly - 2-3 sets - 10 reps - 5-10 sec hold  Sit to Stand - 1 x daily - 7 x weekly - 2-3 sets - 10 reps - 3-5 sec hold  Side Lunge Adductor Stretch - 2 x daily - 7 x weekly - 1 sets - 3 reps - 30 sec hold  Clamshell with Resistance - 2 x daily - 7 x weekly - 2 sets - 10 reps - 3 sec hold

## 2020-12-26 ENCOUNTER — Encounter: Payer: Self-pay | Admitting: Sports Medicine

## 2020-12-26 ENCOUNTER — Ambulatory Visit (INDEPENDENT_AMBULATORY_CARE_PROVIDER_SITE_OTHER): Payer: BC Managed Care – PPO | Admitting: Sports Medicine

## 2020-12-26 DIAGNOSIS — G8929 Other chronic pain: Secondary | ICD-10-CM

## 2020-12-26 DIAGNOSIS — M25552 Pain in left hip: Secondary | ICD-10-CM | POA: Diagnosis not present

## 2020-12-26 NOTE — Progress Notes (Signed)
    Procedures performed today:    None.  Independent interpretation of notes and tests performed by another provider:   I did personally review his x-rays, overall they look normal, there is an odd sclerotic focus in his left pubic ramus just lateral to the pubic symphysis.  Brief History, Exam, Impression, and Recommendations:    Chronic left hip pain This is a pleasant 45 year old male, in early May he was sitting down, moved his leg and felt severe pain in his left groin. Pain has worsened recently. He has been evaluated by several providers here, he has had formal physical therapy, steroids, NSAIDs. Unfortunately continues to have discomfort that he localizes deep in the groin near the adductor tubercle/insertion.  On exam his hip is for the most part unremarkable with good motion, good strength, no pain with internal rotation, negative FABER and FADIR signs. He does have some tenderness along his pubic rami to palpation, as well as near the symphysis pubis. I did personally review his x-rays, overall they look normal, there is an odd sclerotic focus in his left pubic ramus just lateral to the pubic symphysis. Hernia exam is negative. Continue meloxicam, but at this juncture we certainly need an MRI for further characterization. Return to see me for MRI results.  Chronic process with exacerbation.    ___________________________________________ Ihor Austin. Benjamin Stain, M.D., ABFM., CAQSM. Primary Care and Sports Medicine [facility]  Adjunct Instructor of Family Medicine  [employer]

## 2020-12-26 NOTE — Assessment & Plan Note (Addendum)
This is a pleasant 45 year old male, in early May he was sitting down, moved his leg and felt severe pain in his left groin. Pain has worsened recently. He has been evaluated by several providers here, he has had formal physical therapy, steroids, NSAIDs. Unfortunately continues to have discomfort that he localizes deep in the groin near the adductor tubercle/insertion.  On exam his hip is for the most part unremarkable with good motion, good strength, no pain with internal rotation, negative FABER and FADIR signs. He does have some tenderness along his pubic rami to palpation, as well as near the symphysis pubis. I did personally review his x-rays, overall they look normal, there is an odd sclerotic focus in his left pubic ramus just lateral to the pubic symphysis. Hernia exam is negative. Continue meloxicam, but at this juncture we certainly need an MRI for further characterization. Return to see me for MRI results.  Chronic process with exacerbation.

## 2020-12-29 ENCOUNTER — Telehealth: Payer: Self-pay

## 2020-12-29 ENCOUNTER — Encounter: Payer: Self-pay | Admitting: Sports Medicine

## 2020-12-29 NOTE — Telephone Encounter (Signed)
Paient called back requesting a work note for his visit on 12/26/20.

## 2020-12-29 NOTE — Telephone Encounter (Signed)
Patient aware letter written and he can access it through mychart.

## 2020-12-29 NOTE — Telephone Encounter (Signed)
Letter written

## 2021-01-02 ENCOUNTER — Telehealth: Payer: Self-pay

## 2021-01-02 NOTE — Telephone Encounter (Signed)
Received fax from [facility] requesting medical documentation.   Faxed all requested documentation to [facility] [phone]) on 01/02/2021 @1600 .

## 2021-01-03 ENCOUNTER — Ambulatory Visit (INDEPENDENT_AMBULATORY_CARE_PROVIDER_SITE_OTHER): Payer: BC Managed Care – PPO

## 2021-01-03 ENCOUNTER — Other Ambulatory Visit: Payer: Self-pay

## 2021-01-03 DIAGNOSIS — R1030 Lower abdominal pain, unspecified: Secondary | ICD-10-CM

## 2021-01-03 DIAGNOSIS — R600 Localized edema: Secondary | ICD-10-CM | POA: Diagnosis not present

## 2021-01-03 DIAGNOSIS — G8929 Other chronic pain: Secondary | ICD-10-CM

## 2021-01-03 DIAGNOSIS — M25552 Pain in left hip: Secondary | ICD-10-CM | POA: Diagnosis not present

## 2021-01-08 ENCOUNTER — Other Ambulatory Visit: Payer: Self-pay

## 2021-01-08 ENCOUNTER — Ambulatory Visit: Payer: BC Managed Care – PPO | Admitting: Family Medicine

## 2021-01-08 ENCOUNTER — Encounter: Payer: Self-pay | Admitting: Family Medicine

## 2021-01-08 DIAGNOSIS — M25552 Pain in left hip: Secondary | ICD-10-CM | POA: Diagnosis not present

## 2021-01-08 DIAGNOSIS — G8929 Other chronic pain: Secondary | ICD-10-CM

## 2021-01-08 DIAGNOSIS — N529 Male erectile dysfunction, unspecified: Secondary | ICD-10-CM

## 2021-01-08 MED ORDER — TADALAFIL 20 MG PO TABS: 10.0000 mg | ORAL_TABLET | ORAL | 11 refills | Status: DC | PRN

## 2021-01-08 NOTE — Assessment & Plan Note (Signed)
MRI without significant hip pathology. Recommend that he follow up with PT.  He may continue meloxicam as needed.  Some SI inflammation.  Recommend f/u with Dr. Benjamin Stain if symptoms persist.

## 2021-01-08 NOTE — Progress Notes (Signed)
Adam Norton - 45 y.o. male MRN [id number]  Date of birth: January 02, 1976  Subjective Chief Complaint  Patient presents with   Groin Pain    HPI Adam Norton is a 45 y.o. male here today for follow up of hip pain.  He continues to experience intermittent hip pain.  This is worse with lifting or squatting.  He did see Dr. Benjamin Stain and had MRI of the hip.  This did not show any pathology of the hip but did show some SI inflammation.  He is not really having pain in this area.  His employer doesn't really have light duty work for him which was recommended.  He is requesting additional letter for this.    He has had some issues with achieving and maintaining erections.    ROS:  A comprehensive ROS was completed and negative except as noted per HPI  No Known Allergies  Past Medical History:  Diagnosis Date   Kidney stone     Past Surgical History:  Procedure Laterality Date   KIDNEY STONE SURGERY      Social History   Socioeconomic History   Marital status: Single    Spouse name: Not on file   Number of children: Not on file   Years of education: Not on file   Highest education level: Not on file  Occupational History   Not on file  Tobacco Use   Smoking status: Every Day    Packs/day: 1.00    Pack years: 0.00    Types: Cigarettes   Smokeless tobacco: Never  Vaping Use   Vaping Use: Never used  Substance and Sexual Activity   Alcohol use: Not Currently    Alcohol/week: 0.0 standard drinks   Drug use: No   Sexual activity: Not on file  Other Topics Concern   Not on file  Social History Narrative   Not on file   Social Determinants of Health   Financial Resource Strain: Not on file  Food Insecurity: Not on file  Transportation Needs: Not on file  Physical Activity: Not on file  Stress: Not on file  Social Connections: Not on file    Family History  Problem Relation Age of Onset   Alcohol abuse Father    Diabetes Father    Cancer Father    Alcohol abuse  Brother     Health Maintenance  Topic Date Due   Pneumococcal Vaccine 77-55 Years old (1 - PCV) Never done   Hepatitis C Screening  Never done   COLONOSCOPY (Pts 45-66yrs Insurance coverage will need to be confirmed)  Never done   COVID-19 Vaccine (1) 06/13/2021 (Originally 08/28/1980)   INFLUENZA VACCINE  02/16/2021   TETANUS/TDAP  09/28/2026   HIV Screening  Completed   HPV VACCINES  Aged Out     ----------------------------------------------------------------------------------------------------------------------------------------------------------------------------------------------------------------- Physical Exam BP 99/63 (BP Location: Right Arm, Patient Position: Sitting, Cuff Size: Normal)   Pulse 89   Wt 148 lb (67.1 kg)   SpO2 95%   BMI 22.50 kg/m   Physical Exam Constitutional:      Appearance: Normal appearance.  HENT:     Head: Normocephalic and atraumatic.  Musculoskeletal:     Cervical back: Neck supple.  Neurological:     Mental Status: He is alert.  Psychiatric:        Mood and Affect: Mood normal.        Behavior: Behavior normal.    ------------------------------------------------------------------------------------------------------------------------------------------------------------------------------------------------------------------- Assessment and Plan  Chronic left hip pain MRI without significant hip  pathology. Recommend that he follow up with PT.  He may continue meloxicam as needed.  Some SI inflammation.  Recommend f/u with Dr. Benjamin Stain if symptoms persist.    Erectile dysfunction Discussed options for management.  Starting tadalafil 10-20mg  as needed. Side effects reviewed. Discussed red flags including prolonged erection and avoidance of NTG containing products.    Meds ordered this encounter  Medications   tadalafil (CIALIS) 20 MG tablet    Sig: Take 0.5-1 tablets (10-20 mg total) by mouth every other day as needed for erectile  dysfunction.    Dispense:  10 tablet    Refill:  11    No follow-ups on file.    This visit occurred during the SARS-CoV-2 public health emergency.  Safety protocols were in place, including screening questions prior to the visit, additional usage of staff PPE, and extensive cleaning of exam room while observing appropriate contact time as indicated for disinfecting solutions.

## 2021-01-08 NOTE — Assessment & Plan Note (Signed)
Discussed options for management.  Starting tadalafil 10-20mg  as needed. Side effects reviewed. Discussed red flags including prolonged erection and avoidance of NTG containing products.

## 2021-06-18 ENCOUNTER — Other Ambulatory Visit: Payer: Self-pay | Admitting: Family Medicine

## 2021-06-23 ENCOUNTER — Encounter: Payer: Self-pay | Admitting: Family Medicine

## 2021-06-23 ENCOUNTER — Telehealth (INDEPENDENT_AMBULATORY_CARE_PROVIDER_SITE_OTHER): Payer: Self-pay | Admitting: Family Medicine

## 2021-06-23 VITALS — Ht 68.0 in | Wt 150.0 lb

## 2021-06-23 DIAGNOSIS — J0140 Acute pansinusitis, unspecified: Secondary | ICD-10-CM

## 2021-06-23 MED ORDER — AMOXICILLIN-POT CLAVULANATE 875-125 MG PO TABS: 1.0000 | ORAL_TABLET | Freq: Two times a day (BID) | ORAL | 0 refills | Status: AC

## 2021-06-23 MED ORDER — BENZONATATE 200 MG PO CAPS: 200.0000 mg | ORAL_CAPSULE | Freq: Two times a day (BID) | ORAL | 0 refills | Status: DC | PRN

## 2021-06-23 MED ORDER — ALBUTEROL SULFATE HFA 108 (90 BASE) MCG/ACT IN AERS: 2.0000 | INHALATION_SPRAY | Freq: Four times a day (QID) | RESPIRATORY_TRACT | 11 refills | Status: DC | PRN

## 2021-06-23 NOTE — Progress Notes (Signed)
Virtual Video Visit via MyChart Note converted to telephone call d/t technical difficulties  I connected with  Adam Norton on 06/23/21 at  9:10 AM EST by the video enabled telemedicine application for MyChart, and verified that I am speaking with the correct person using two identifiers.   I introduced myself as a Publishing rights manager with the practice. We discussed the limitations of evaluation and management by telemedicine and the availability of in person appointments. The patient expressed understanding and agreed to proceed.  Participating parties in this visit include: The patient and the nurse practitioner listed.  The patient is: At home I am: In the office - [name]  Subjective:    CC:  Chief Complaint  Patient presents with   FLU LIKE SYMPTOMS    Since last week. Taking OTC medicines - Robitussin and mucinex.     HPI: Adam Norton is a 45 y.o. year old male presenting today via MyChart today for upper respiratory symptoms.  For the past 8-9 days, patient has had upper respiratory symptoms including cough with yellow sputum, congestion, sinus pressure, eye puffiness, rhinorrhea, sore throat, headaches, occasional mild wheezing, intermittent body aches. He denies any GI/GU symptoms, chest pain, dyspnea, rashes, loss of taste/smell.    Past medical history, Surgical history, Family history not pertinant except as noted below, Social history, Allergies, and medications have been entered into the medical record, reviewed, and corrections made.   Review of Systems:  All review of systems negative except what is listed in the HPI   Objective:    General:  Speaking clearly in complete sentences. Absent shortness of breath noted.   Alert and oriented x3.   Normal judgment.  Absent acute distress.   Impression and Recommendations:    1. Acute non-recurrent pansinusitis - albuterol (VENTOLIN HFA) 108 (90 Base) MCG/ACT inhaler; Inhale 2 puffs into the lungs  every 6 (six) hours as needed for wheezing.  Dispense: 2 each; Refill: 11 - amoxicillin-clavulanate (AUGMENTIN) 875-125 MG tablet; Take 1 tablet by mouth 2 (two) times daily for 10 days.  Dispense: 20 tablet; Refill: 0 - benzonatate (TESSALON) 200 MG capsule; Take 1 capsule (200 mg total) by mouth 2 (two) times daily as needed for cough.  Dispense: 20 capsule; Refill: 0  Given duration, treating with Augmentin. Adding in albuterol inhaler for occasional wheeze as his is likely expired. Benzonatate added for cough. Discussed pulmonary hygiene. Continue supportive measures including rest, hydration, humidifier use, steam showers, warm compresses to sinuses, warm liquids with lemon and honey, and over-the-counter cough, cold, and analgesics as needed.   Follow-up if symptoms worsen or fail to improve.    I discussed the assessment and treatment plan with the patient. The patient was provided an opportunity to ask questions and all were answered. The patient agreed with the plan and demonstrated an understanding of the instructions.   The patient was advised to call back or seek an in-person evaluation if the symptoms worsen or if the condition fails to improve as anticipated.  I spent 20 minutes dedicated to the care of this patient on the date of this encounter to include pre-visit chart review of prior notes and results, face-to-face time with the patient, and post-visit ordering of testing as indicated.   Clayborne Dana, NP

## 2021-06-23 NOTE — Patient Instructions (Signed)
Given duration, treating with Augmentin. Adding in albuterol inhaler for occasional wheeze as his is likely expired. Benzonatate added for cough. Discussed pulmonary hygiene. Continue supportive measures including rest, hydration, humidifier use, steam showers, warm compresses to sinuses, warm liquids with lemon and honey, and over-the-counter cough, cold, and analgesics as needed.   Please contact office for follow-up if symptoms do not improve or worsen. Seek emergency care if symptoms become severe.

## 2021-06-24 ENCOUNTER — Telehealth: Payer: Self-pay

## 2021-06-24 NOTE — Telephone Encounter (Signed)
Patient advised.

## 2021-06-24 NOTE — Telephone Encounter (Signed)
Adam Norton called and left a message requesting a work note for today and tomorrow. Please advise.

## 2021-11-08 ENCOUNTER — Emergency Department (HOSPITAL_COMMUNITY): Payer: PRIVATE HEALTH INSURANCE

## 2021-11-08 ENCOUNTER — Encounter (HOSPITAL_COMMUNITY): Payer: Self-pay | Admitting: Emergency Medicine

## 2021-11-08 ENCOUNTER — Emergency Department (HOSPITAL_COMMUNITY)
Admission: EM | Admit: 2021-11-08 | Discharge: 2021-11-08 | Disposition: A | Discharge status: Home / Self Care | Payer: PRIVATE HEALTH INSURANCE | Attending: Emergency Medicine | Admitting: Emergency Medicine

## 2021-11-08 ENCOUNTER — Other Ambulatory Visit: Payer: Self-pay

## 2021-11-08 DIAGNOSIS — N201 Calculus of ureter: Secondary | ICD-10-CM | POA: Insufficient documentation

## 2021-11-08 DIAGNOSIS — R109 Unspecified abdominal pain: Secondary | ICD-10-CM | POA: Diagnosis present

## 2021-11-08 DIAGNOSIS — R110 Nausea: Secondary | ICD-10-CM | POA: Diagnosis not present

## 2021-11-08 DIAGNOSIS — N200 Calculus of kidney: Secondary | ICD-10-CM | POA: Insufficient documentation

## 2021-11-08 LAB — BASIC METABOLIC PANEL
Anion gap: 7 (ref 5–15)
BUN: 13 mg/dL (ref 6–20)
CO2: 22 mmol/L (ref 22–32)
Calcium: 9 mg/dL (ref 8.9–10.3)
Chloride: 104 mmol/L (ref 98–111)
Creatinine, Ser: 0.78 mg/dL (ref 0.61–1.24)
GFR, Estimated: >60 mL/min (ref >60)
Glucose, Bld: 76 mg/dL (ref 70–99)
Potassium: 3.9 mmol/L (ref 3.5–5.1)
Sodium: 133 mmol/L — ABNORMAL LOW (ref 135–145)

## 2021-11-08 LAB — CBC
HCT: 43.1 % (ref 39.0–52.0)
Hemoglobin: 14.1 g/dL (ref 13.0–17.0)
MCH: 28.1 pg (ref 26.0–34.0)
MCHC: 32.7 g/dL (ref 30.0–36.0)
MCV: 86 fL (ref 80.0–100.0)
Platelets: 432 10*3/uL — ABNORMAL HIGH (ref 150–400)
RBC: 5.01 MIL/uL (ref 4.22–5.81)
RDW: 14.5 % (ref 11.5–15.5)
WBC: 10.8 10*3/uL — ABNORMAL HIGH (ref 4.0–10.5)
nRBC: 0 % (ref 0.0–0.2)

## 2021-11-08 LAB — URINALYSIS, ROUTINE W REFLEX MICROSCOPIC
Bacteria, UA: NONE SEEN
Bilirubin Urine: NEGATIVE
Glucose, UA: NEGATIVE mg/dL
Ketones, ur: NEGATIVE mg/dL
Leukocytes,Ua: NEGATIVE
Nitrite: NEGATIVE
Protein, ur: NEGATIVE mg/dL
RBC / HPF: >50 RBC/hpf — ABNORMAL HIGH (ref 0–5)
Specific Gravity, Urine: 1.02 (ref 1.005–1.030)
pH: 5 (ref 5.0–8.0)

## 2021-11-08 MED ORDER — SODIUM CHLORIDE 0.9 % IV BOLUS
1000.0000 mL | Freq: Once | INTRAVENOUS | Status: AC
  Administered 2021-11-08: 1000 mL via INTRAVENOUS

## 2021-11-08 MED ORDER — ONDANSETRON HCL 4 MG PO TABS: 4.0000 mg | ORAL_TABLET | Freq: Three times a day (TID) | ORAL | 0 refills | Status: DC | PRN

## 2021-11-08 MED ORDER — HYDROCODONE-ACETAMINOPHEN 5-325 MG PO TABS: 2.0000 | ORAL_TABLET | ORAL | 0 refills | Status: DC | PRN

## 2021-11-08 MED ORDER — ONDANSETRON HCL 4 MG/2ML IJ SOLN
4.0000 mg | Freq: Once | INTRAMUSCULAR | Status: AC
  Administered 2021-11-08: 4 mg via INTRAVENOUS
  Filled 2021-11-08: qty 2

## 2021-11-08 MED ORDER — FENTANYL CITRATE PF 50 MCG/ML IJ SOSY
50.0000 ug | PREFILLED_SYRINGE | Freq: Once | INTRAMUSCULAR | Status: AC
  Administered 2021-11-08: 50 ug via INTRAVENOUS
  Filled 2021-11-08: qty 1

## 2021-11-08 NOTE — ED Provider Notes (Signed)
?[facility] EMERGENCY DEPARTMENT ?Provider Note ? ? ?CSN: [id number] ?Arrival date & time: 11/08/21  1840 ? ?  ? ?History ? ?Chief Complaint  ?Patient presents with  ? Flank Pain  ? ? ?Adam Norton is a 46 y.o. male.  Patient complains of right-sided flank pain that began this morning.  Patient complains of pain that comes in waves.  Endorses intermittent dysuria.  Denies hematuria.  Endorses nausea but no vomiting.  Patient states that this is very similar to pain to when he has had kidney stones in the past.  He has previously required lithotripsy.  Past medical history significant for nephrolithiasis, cannabis abuse, depression, nicotine dependence, general anxiety disorder ? ?HPI ? ?  ? ?Home Medications ?Prior to Admission medications   ?Medication Sig Start Date End Date Taking? Authorizing Provider  ?HYDROcodone-acetaminophen (NORCO/VICODIN) 5-325 MG tablet Take 2 tablets by mouth every 4 (four) hours as needed. 11/08/21  Yes Darrick Grinder, PA-C  ?ondansetron (ZOFRAN) 4 MG tablet Take 1 tablet (4 mg total) by mouth every 8 (eight) hours as needed for nausea or vomiting. 11/08/21  Yes Darrick Grinder, PA-C  ?albuterol (VENTOLIN HFA) 108 (90 Base) MCG/ACT inhaler Inhale 2 puffs into the lungs every 6 (six) hours as needed for wheezing. 06/23/21   Clayborne Dana, NP  ?benzonatate (TESSALON) 200 MG capsule Take 1 capsule (200 mg total) by mouth 2 (two) times daily as needed for cough. 06/23/21   Clayborne Dana, NP  ?meloxicam (MOBIC) 15 MG tablet Take 1 tablet (15 mg total) by mouth daily. 11/25/20   Clayborne Dana, NP  ?sertraline (ZOLOFT) 100 MG tablet Take 1 tablet (100 mg total) by mouth daily. 05/28/20   Everrett Coombe, DO  ?tadalafil (CIALIS) 20 MG tablet Take 0.5-1 tablets (10-20 mg total) by mouth every other day as needed for erectile dysfunction. 01/08/21   Everrett Coombe, DO  ?traZODone (DESYREL) 100 MG tablet TAKE 1 TABLET(100 MG) BY MOUTH AT BEDTIME 06/18/21   Everrett Coombe, DO  ?    ? ?Allergies    ?Patient has no known allergies.   ? ?Review of Systems   ?Review of Systems  ?Constitutional:  Negative for fever.  ?Respiratory:  Negative for shortness of breath.   ?Cardiovascular:  Negative for chest pain.  ?Gastrointestinal:  Positive for abdominal pain and nausea. Negative for constipation, diarrhea and vomiting.  ?Genitourinary:  Positive for dysuria and flank pain. Negative for hematuria.  ? ?Physical Exam ?Updated Vital Signs ?BP 112/70 (BP Location: Left Arm)   Pulse 72   Temp 98.2 ?F (36.8 ?C) (Oral)   Resp 18   SpO2 97%  ?Physical Exam ?Vitals and nursing note reviewed.  ?Constitutional:   ?   General: He is not in acute distress. ?   Appearance: He is normal weight.  ?HENT:  ?   Head: Normocephalic.  ?Eyes:  ?   Conjunctiva/sclera: Conjunctivae normal.  ?Cardiovascular:  ?   Rate and Rhythm: Normal rate and regular rhythm.  ?   Pulses: Normal pulses.  ?   Heart sounds: Normal heart sounds.  ?Pulmonary:  ?   Effort: Pulmonary effort is normal.  ?   Breath sounds: Normal breath sounds.  ?Abdominal:  ?   Palpations: Abdomen is soft.  ?   Tenderness: There is abdominal tenderness. There is right CVA tenderness. There is no left CVA tenderness.  ?   Comments: Mild tenderness to palpation on the right side of the abdomen.  Right  CVA tenderness  ?Musculoskeletal:  ?   Cervical back: Normal range of motion.  ?Skin: ?   General: Skin is warm and dry.  ?Neurological:  ?   Mental Status: He is alert.  ? ? ?ED Results / Procedures / Treatments   ?Labs ?(all labs ordered are listed, but only abnormal results are displayed) ?Labs Reviewed  ?URINALYSIS, ROUTINE W REFLEX MICROSCOPIC - Abnormal; Notable for the following components:  ?    Result Value  ? Hgb urine dipstick MODERATE (*)   ? RBC / HPF >50 (*)   ? All other components within normal limits  ?BASIC METABOLIC PANEL - Abnormal; Notable for the following components:  ? Sodium 133 (*)   ? All other components within normal limits  ?CBC -  Abnormal; Notable for the following components:  ? WBC 10.8 (*)   ? Platelets 432 (*)   ? All other components within normal limits  ? ? ?EKG ?None ? ?Radiology ?CT Renal Stone Study ? ?Result Date: 11/08/2021 ?CLINICAL DATA:  Right flank pain. History of renal stones EXAM: CT ABDOMEN AND PELVIS WITHOUT CONTRAST TECHNIQUE: Multidetector CT imaging of the abdomen and pelvis was performed following the standard protocol without IV contrast. RADIATION DOSE REDUCTION: This exam was performed according to the departmental dose-optimization program which includes automated exposure control, adjustment of the mA and/or kV according to patient size and/or use of iterative reconstruction technique. COMPARISON:  None. FINDINGS: Lower chest: No acute abnormality. Hepatobiliary: No focal liver abnormality. No gallstones, gallbladder wall thickening, or pericholecystic fluid. No biliary dilatation. Pancreas: No focal lesion. Normal pancreatic contour. No surrounding inflammatory changes. No main pancreatic ductal dilatation. Spleen: Normal in size without focal abnormality. Adrenals/Urinary Tract: No adrenal nodule bilaterally. Bilateral 1-2 mm nephrolithiasis. No hydronephrosis. No definite contour-deforming renal mass. Distal right ureter 4 mm calcified stone. Associated proximal mild right hydroureter. No frank right hydronephrosis. The urinary bladder is unremarkable. Stomach/Bowel: Stomach is within normal limits. No evidence of bowel wall thickening or dilatation. Appendix appears normal. Vascular/Lymphatic: No abdominal aorta or iliac aneurysm. Mild atherosclerotic plaque of the aorta and its branches. No abdominal, pelvic, or inguinal lymphadenopathy. Reproductive: Prostate is unremarkable. Other: No intraperitoneal free fluid. No intraperitoneal free gas. No organized fluid collection. Musculoskeletal: No abdominal wall hernia or abnormality. No suspicious lytic or blastic osseous lesions. No acute displaced fracture.  IMPRESSION: 1. Obstructive distal right ureterolithiasis measuring up to 4 mm. 2. Nonobstructive bilateral nephrolithiasis measuring 1-2 mm. 3.  Aortic Atherosclerosis (ICD10-I70.0). Electronically Signed   By: Tish Frederickson M.D.   On: 11/08/2021 20:52   ? ?Procedures ?Procedures  ? ?Medications Ordered in ED ?Medications  ?fentaNYL (SUBLIMAZE) injection 50 mcg (50 mcg Intravenous Given 11/08/21 2004)  ?sodium chloride 0.9 % bolus 1,000 mL (0 mLs Intravenous Stopped 11/08/21 2145)  ?ondansetron [facility]) injection 4 mg (4 mg Intravenous Given 11/08/21 2004)  ? ? ?ED Course/ Medical Decision Making/ A&P ?  ?                        ?Medical Decision Making ?Amount and/or Complexity of Data Reviewed ?Labs: ordered. ?Radiology: ordered. ? ?Risk ?Prescription drug management. ? ? ?This patient presents to the ED for concern of right-sided flank pain, this involves an extensive number of treatment options, and is a complaint that carries with it a high risk of complications and morbidity.  The differential diagnosis includes nephrolithiasis, pyelonephritis, musculoskeletal pain, and others ? ? ?Co morbidities that complicate the  patient evaluation ? ?History of nephrolithiasis ? ? ?Additional history obtained: ? ?Additional history obtained from patient's acquaintance ? ? ?Lab Tests: ? ?I Ordered, and personally interpreted labs.  The pertinent results include: Moderate hemoglobin on urine dipstick, WBC 10.8 ? ? ?Imaging Studies ordered: ? ?I ordered imaging studies including CT renal stone study ?I independently visualized and interpreted imaging which showed obstructive distal right ureterolithiasis measuring up to 4 mm nonobstructive bilateral nephrolithiasis measuring 1 to 2 mm ?I agree with the radiologist interpretation ? ? ?Problem List / ED Course / Critical interventions / Medication management ? ? ?I ordered medication including Zofran for nausea, fentanyl for pain, saline for hydration ?Reevaluation of the  patient after these medicines showed that the patient improved ?I have reviewed the patients home medicines and have made adjustments as needed ? ? ?Test / Admission - Considered: ? ?The patient is a 4 mm stone on t

## 2021-11-08 NOTE — ED Triage Notes (Signed)
Pt with right flank pain. Hx of kidney stones and feels like the same.  ?

## 2021-11-08 NOTE — Discharge Instructions (Addendum)
You were seen today for right-sided flank pain.  You have a small stone that should be able to be passed without intervention.  I have prescribed you pain medication and nausea medication to be taken over the next couple of days as needed.  I have also provided contact information for urology in case you need follow-up appointment. ?

## 2021-11-09 ENCOUNTER — Telehealth: Payer: Self-pay | Admitting: General Practice

## 2021-11-09 NOTE — Telephone Encounter (Signed)
Transition Care Management Unsuccessful Follow-up Telephone Call ? ?Date of discharge and from where:  11/08/21 from [facility] ? ?Attempts:  1st Attempt ? ?Reason for unsuccessful TCM follow-up call:  Left voice message ? ?  ?

## 2021-11-10 NOTE — Telephone Encounter (Signed)
Transition Care Management Unsuccessful Follow-up Telephone Call ? ?Date of discharge and from where:  11/08/21 from [facility] hospital ? ?Attempts:  2nd Attempt ? ?Reason for unsuccessful TCM follow-up call:  Left voice message ? ?  ?

## 2021-11-11 NOTE — Telephone Encounter (Signed)
Transition Care Management Unsuccessful Follow-up Telephone Call ? ?Date of discharge and from where:  11/08/21 from [facility] ? ?Attempts:  3rd Attempt ? ?Reason for unsuccessful TCM follow-up call:  Left voice message ? ?  ?

## 2021-11-17 ENCOUNTER — Other Ambulatory Visit: Payer: Self-pay

## 2021-11-17 ENCOUNTER — Ambulatory Visit (INDEPENDENT_AMBULATORY_CARE_PROVIDER_SITE_OTHER): Payer: BLUE CROSS/BLUE SHIELD | Admitting: Medical-Surgical

## 2021-11-17 ENCOUNTER — Encounter: Payer: Self-pay | Admitting: Medical-Surgical

## 2021-11-17 VITALS — BP 109/70 | HR 66 | Resp 20 | Ht 68.0 in | Wt 130.6 lb

## 2021-11-17 DIAGNOSIS — N200 Calculus of kidney: Secondary | ICD-10-CM | POA: Diagnosis not present

## 2021-11-17 MED ORDER — TAMSULOSIN HCL 0.4 MG PO CAPS: 0.4000 mg | ORAL_CAPSULE | Freq: Every day | ORAL | 0 refills | Status: DC

## 2021-11-17 NOTE — Telephone Encounter (Signed)
Patient forgot to ask for a Rx refill for Hydrocodone and he said that you were suppose to send in a Rx for flonase to the pharmacy and it hasn't been sent in yet. ?

## 2021-11-17 NOTE — Progress Notes (Signed)
?  HPI with pertinent ROS:  ? ?CC: hospital discharge follow up ? ?HPI: ?Pleasant 46 year old male presenting today for a hospital discharge follow-up.  He was seen on 11/08/2021 for nephrolithiasis and found to have an obstructive 4 mm stone as well as 2 nonobstructing stones that were 1-2 mm each.  He was prescribed hydrocodone for pain control as well as Zofran for nausea and advised to follow-up with urology as soon as possible.  To date he has not been contacted by urology although he does have the contact information available on his AVS from the hospital.  He continues to have discomfort, mostly in the suprapubic/pelvic area.  Frequently urinating and working to stay well-hydrated.  Has not passed any stones to date and has not seen any blood in his urine.  Has not been taking the hydrocodone for pain as this makes him sleepy and he has difficulty focusing and functioning at work.  Has missed some time from work and is worried about his employment since he works for a [employer].  Asking for a work note to cover the time missed. ? ?I reviewed the past medical history, family history, social history, surgical history, and allergies today and no changes were needed.  Please see the problem list section below in epic for further details. ? ?Physical exam:  ? ?General: Well Developed, well nourished, and in no acute distress.  ?Neuro: Alert and oriented x3.  ?HEENT: Normocephalic, atraumatic.  ?Skin: Warm and dry. ?Cardiac: Regular rate and rhythm, no murmurs rubs or gallops, no lower extremity edema.  ?Respiratory: Clear to auscultation bilaterally. Not using accessory muscles, speaking in full sentences. ? ?Impression and Recommendations:   ? ?1. Nephrolithiasis ?Starting Flomax 0.4 mg daily.  Increase hydration to push fluids.  Urine strainer provided for patient use.  Okay to use hydrocodone-APAP for pain control when not having to work.  Work note provided to clear him for time missed from 11/11/2021  through 11/17/2021.  He requested to return to work on 11/18/2021 so work note also included this information.  Advised him to contact urology as soon as possible to get scheduled for an appointment for further evaluation and management.  Patient verbalized understanding and is agreeable to the plan. ? ?Return for follow up with Urology as instructed. See PCP for follow up as needed.. ?___________________________________________ ?Thayer Ohm, DNP, APRN, FNP-BC ?Primary Care and Sports Medicine ?[name] ?

## 2021-11-18 MED ORDER — HYDROCODONE-ACETAMINOPHEN 5-325 MG PO TABS: 2.0000 | ORAL_TABLET | ORAL | 0 refills | Status: DC | PRN

## 2021-11-18 NOTE — Telephone Encounter (Signed)
I spoke with the patient and he is needing a refill on the Hydrocodone for the pain from the kidney stones. ?

## 2021-12-06 ENCOUNTER — Other Ambulatory Visit: Payer: Self-pay

## 2021-12-06 ENCOUNTER — Encounter (HOSPITAL_COMMUNITY): Payer: Self-pay | Admitting: Emergency Medicine

## 2021-12-06 ENCOUNTER — Emergency Department (HOSPITAL_COMMUNITY)
Admission: EM | Admit: 2021-12-06 | Discharge: 2021-12-07 | Disposition: A | Discharge status: Home / Self Care | Payer: PRIVATE HEALTH INSURANCE | Attending: Emergency Medicine | Admitting: Emergency Medicine

## 2021-12-06 ENCOUNTER — Emergency Department (HOSPITAL_COMMUNITY): Payer: PRIVATE HEALTH INSURANCE

## 2021-12-06 DIAGNOSIS — N132 Hydronephrosis with renal and ureteral calculous obstruction: Secondary | ICD-10-CM | POA: Diagnosis not present

## 2021-12-06 DIAGNOSIS — D72829 Elevated white blood cell count, unspecified: Secondary | ICD-10-CM | POA: Diagnosis not present

## 2021-12-06 DIAGNOSIS — R109 Unspecified abdominal pain: Secondary | ICD-10-CM | POA: Diagnosis present

## 2021-12-06 LAB — COMPREHENSIVE METABOLIC PANEL
ALT: 17 U/L (ref 0–44)
AST: 20 U/L (ref 15–41)
Albumin: 4.1 g/dL (ref 3.5–5.0)
Alkaline Phosphatase: 138 U/L — ABNORMAL HIGH (ref 38–126)
Anion gap: 10 (ref 5–15)
BUN: 13 mg/dL (ref 6–20)
CO2: 22 mmol/L (ref 22–32)
Calcium: 9.7 mg/dL (ref 8.9–10.3)
Chloride: 107 mmol/L (ref 98–111)
Creatinine, Ser: 0.93 mg/dL (ref 0.61–1.24)
GFR, Estimated: >60 mL/min (ref >60)
Glucose, Bld: 68 mg/dL — ABNORMAL LOW (ref 70–99)
Potassium: 4 mmol/L (ref 3.5–5.1)
Sodium: 139 mmol/L (ref 135–145)
Total Bilirubin: 1 mg/dL (ref 0.3–1.2)
Total Protein: 7.8 g/dL (ref 6.5–8.1)

## 2021-12-06 LAB — CBC WITH DIFFERENTIAL/PLATELET
Abs Immature Granulocytes: 0.06 10*3/uL (ref 0.00–0.07)
Basophils Absolute: 0.1 10*3/uL (ref 0.0–0.1)
Basophils Relative: 0 %
Eosinophils Absolute: 0.1 10*3/uL (ref 0.0–0.5)
Eosinophils Relative: 1 %
HCT: 47.6 % (ref 39.0–52.0)
Hemoglobin: 15 g/dL (ref 13.0–17.0)
Immature Granulocytes: 1 %
Lymphocytes Relative: 23 %
Lymphs Abs: 2.6 10*3/uL (ref 0.7–4.0)
MCH: 28 pg (ref 26.0–34.0)
MCHC: 31.5 g/dL (ref 30.0–36.0)
MCV: 88.8 fL (ref 80.0–100.0)
Monocytes Absolute: 0.9 10*3/uL (ref 0.1–1.0)
Monocytes Relative: 8 %
Neutro Abs: 7.8 10*3/uL — ABNORMAL HIGH (ref 1.7–7.7)
Neutrophils Relative %: 67 %
Platelets: 412 10*3/uL — ABNORMAL HIGH (ref 150–400)
RBC: 5.36 MIL/uL (ref 4.22–5.81)
RDW: 14.9 % (ref 11.5–15.5)
WBC: 11.5 10*3/uL — ABNORMAL HIGH (ref 4.0–10.5)
nRBC: 0 % (ref 0.0–0.2)

## 2021-12-06 LAB — URINALYSIS, ROUTINE W REFLEX MICROSCOPIC
Bacteria, UA: NONE SEEN
Bilirubin Urine: NEGATIVE
Glucose, UA: NEGATIVE mg/dL
Ketones, ur: 5 mg/dL — AB
Leukocytes,Ua: NEGATIVE
Nitrite: NEGATIVE
Protein, ur: NEGATIVE mg/dL
Specific Gravity, Urine: 1.025 (ref 1.005–1.030)
pH: 5 (ref 5.0–8.0)

## 2021-12-06 MED ORDER — OXYCODONE-ACETAMINOPHEN 5-325 MG PO TABS
1.0000 | ORAL_TABLET | Freq: Once | ORAL | Status: AC
  Administered 2021-12-06: 1 via ORAL
  Filled 2021-12-06: qty 1

## 2021-12-06 NOTE — ED Triage Notes (Signed)
Patient reports persistent right flank pain for several days , denies hematuria or fever , history of kidney stones.

## 2021-12-07 DIAGNOSIS — N132 Hydronephrosis with renal and ureteral calculous obstruction: Secondary | ICD-10-CM | POA: Diagnosis not present

## 2021-12-07 MED ORDER — HYDROMORPHONE HCL 1 MG/ML IJ SOLN
0.5000 mg | Freq: Once | INTRAMUSCULAR | Status: AC
  Administered 2021-12-07: 0.5 mg via INTRAVENOUS
  Filled 2021-12-07: qty 1

## 2021-12-07 MED ORDER — KETOROLAC TROMETHAMINE 15 MG/ML IJ SOLN
15.0000 mg | Freq: Once | INTRAMUSCULAR | Status: AC
Start: 2021-12-07 — End: 2021-12-07
  Administered 2021-12-07: 15 mg via INTRAVENOUS
  Filled 2021-12-07: qty 1

## 2021-12-07 MED ORDER — ONDANSETRON HCL 4 MG/2ML IJ SOLN
4.0000 mg | Freq: Once | INTRAMUSCULAR | Status: AC
  Administered 2021-12-07: 4 mg via INTRAVENOUS
  Filled 2021-12-07: qty 2

## 2021-12-07 MED ORDER — LACTATED RINGERS IV BOLUS
1000.0000 mL | Freq: Once | INTRAVENOUS | Status: AC
Start: 2021-12-07 — End: 2021-12-07
  Administered 2021-12-07: 1000 mL via INTRAVENOUS

## 2021-12-07 MED ORDER — OXYCODONE-ACETAMINOPHEN 5-325 MG PO TABS: 1.0000 | ORAL_TABLET | Freq: Four times a day (QID) | ORAL | 0 refills | Status: DC | PRN

## 2021-12-07 NOTE — Discharge Instructions (Signed)
Please continue to take your daily Flomax.  You have been prescribed a refill of pain medication.  Please continue to drink lots of water and call the urologist listed below for outpatient ER follow-up.  Return to the ER with any fevers, chills, worsening pain, or any other deviation from.

## 2021-12-07 NOTE — ED Provider Notes (Signed)
[facility] EMERGENCY DEPARTMENT Provider Note   CSN: [id number] Arrival date & time: 12/06/21  2311     History  Chief Complaint  Patient presents with   Kidney Stones / Flank Pain     Adonnis Salceda is a 46 y.o. male with history of nephrolithiasis requiring lithotripsy in the past who presents with concern for persistent right-sided flank pain after diagnosis with right ureteral stone 1 month ago.  Endorses pain radiates down into his groin but he denies any pain, swelling in his penis or testicles.  No hematuria but does endorse some increased difficulty urinating this evening.  No fevers or chills at home.  I personally reviewed his medical records.  Has history of nicotine dependence, generalized anxiety disorder.  Not anticoagulated.  Upon chart review had stone removed in 2019 by Dr. Myles Lipps at [facility].  Patient reports he had a stone removed prior to that as well.   HPI     Home Medications Prior to Admission medications   Medication Sig Start Date End Date Taking? Authorizing Provider  oxyCODONE-acetaminophen (PERCOCET/ROXICET) 5-325 MG tablet Take 1 tablet by mouth every 6 (six) hours as needed for severe pain. 12/07/21  Yes Yuridia Couts R, PA-C  albuterol (VENTOLIN HFA) 108 (90 Base) MCG/ACT inhaler Inhale 2 puffs into the lungs every 6 (six) hours as needed for wheezing. 06/23/21   Clayborne Dana, NP  benzonatate (TESSALON) 200 MG capsule Take 1 capsule (200 mg total) by mouth 2 (two) times daily as needed for cough. 06/23/21   Clayborne Dana, NP  meloxicam (MOBIC) 15 MG tablet Take 1 tablet (15 mg total) by mouth daily. 11/25/20   Clayborne Dana, NP  ondansetron (ZOFRAN) 4 MG tablet Take 1 tablet (4 mg total) by mouth every 8 (eight) hours as needed for nausea or vomiting. 11/08/21   Darrick Grinder, PA-C  sertraline (ZOLOFT) 100 MG tablet Take 1 tablet (100 mg total) by mouth daily. 05/28/20   Everrett Coombe, DO  tamsulosin  (FLOMAX) 0.4 MG CAPS capsule Take 1 capsule (0.4 mg total) by mouth daily. 11/17/21   Christen Butter, NP  traZODone (DESYREL) 100 MG tablet TAKE 1 TABLET(100 MG) BY MOUTH AT BEDTIME 06/18/21   Everrett Coombe, DO      Allergies    Patient has no known allergies.    Review of Systems   Review of Systems  Constitutional: Negative.   HENT: Negative.    Respiratory: Negative.    Cardiovascular: Negative.   Gastrointestinal:  Positive for abdominal distention.  Genitourinary:  Positive for dysuria and flank pain. Negative for frequency, hematuria, penile pain, penile swelling, scrotal swelling, testicular pain and urgency.  Skin: Negative.   Neurological: Negative.   Hematological: Negative.    Physical Exam Updated Vital Signs BP 100/60   Pulse (!) 59   Temp 97.9 F (36.6 C)   Resp 10   SpO2 97%  Physical Exam Vitals and nursing note reviewed.  Constitutional:      Appearance: He is not ill-appearing or toxic-appearing.  HENT:     Head: Normocephalic and atraumatic.     Nose: Nose normal.     Mouth/Throat:     Mouth: Mucous membranes are moist.     Pharynx: Oropharynx is clear. Uvula midline. No oropharyngeal exudate or posterior oropharyngeal erythema.     Tonsils: No tonsillar exudate.  Eyes:     General: Lids are normal. Vision grossly intact.  Right eye: No discharge.        Left eye: No discharge.     Extraocular Movements: Extraocular movements intact.     Conjunctiva/sclera: Conjunctivae normal.     Pupils: Pupils are equal, round, and reactive to light.  Neck:     Trachea: Trachea and phonation normal.  Cardiovascular:     Rate and Rhythm: Normal rate and regular rhythm.     Pulses: Normal pulses.     Heart sounds: Normal heart sounds. No murmur heard. Pulmonary:     Effort: Pulmonary effort is normal. No tachypnea, bradypnea, accessory muscle usage, prolonged expiration or respiratory distress.     Breath sounds: Normal breath sounds. No wheezing or rales.   Chest:     Chest wall: No mass, lacerations, deformity, swelling, tenderness, crepitus or edema.  Abdominal:     General: Bowel sounds are normal. There is no distension.     Palpations: Abdomen is soft.     Tenderness: There is abdominal tenderness in the suprapubic area. There is right CVA tenderness. There is no left CVA tenderness, guarding or rebound.  Musculoskeletal:        General: No deformity.     Cervical back: Normal range of motion and neck supple.     Right lower leg: No edema.     Left lower leg: No edema.  Lymphadenopathy:     Cervical: No cervical adenopathy.  Skin:    General: Skin is warm and dry.     Capillary Refill: Capillary refill takes less than 2 seconds.  Neurological:     General: No focal deficit present.     Mental Status: He is alert and oriented to person, place, and time. Mental status is at baseline.     GCS: GCS eye subscore is 4. GCS verbal subscore is 5. GCS motor subscore is 6.     Gait: Gait is intact.  Psychiatric:        Mood and Affect: Mood normal.    ED Results / Procedures / Treatments   Labs (all labs ordered are listed, but only abnormal results are displayed) Labs Reviewed  CBC WITH DIFFERENTIAL/PLATELET - Abnormal; Notable for the following components:      Result Value   WBC 11.5 (*)    Platelets 412 (*)    Neutro Abs 7.8 (*)    All other components within normal limits  COMPREHENSIVE METABOLIC PANEL - Abnormal; Notable for the following components:   Glucose, Bld 68 (*)    Alkaline Phosphatase 138 (*)    All other components within normal limits  URINALYSIS, ROUTINE W REFLEX MICROSCOPIC - Abnormal; Notable for the following components:   Color, Urine AMBER (*)    Hgb urine dipstick MODERATE (*)    Ketones, ur 5 (*)    All other components within normal limits    EKG None  Radiology CT Renal Stone Study  Result Date: 12/06/2021 CLINICAL DATA:  Nephrolithiasis EXAM: CT ABDOMEN AND PELVIS WITHOUT CONTRAST TECHNIQUE:  Multidetector CT imaging of the abdomen and pelvis was performed following the standard protocol without IV contrast. RADIATION DOSE REDUCTION: This exam was performed according to the departmental dose-optimization program which includes automated exposure control, adjustment of the mA and/or kV according to patient size and/or use of iterative reconstruction technique. COMPARISON:  11/08/2021 FINDINGS: Lower chest: No acute abnormality Hepatobiliary: No focal hepatic abnormality. Gallbladder unremarkable. Pancreas: No focal abnormality or ductal dilatation. Spleen: No focal abnormality.  Normal size. Adrenals/Urinary Tract: Bilateral nonobstructing  renal stones. Mild right hydronephrosis previously seen mid right ureter stone now at the right UVJ. Stone measures 4-5 mm. No hydronephrosis on the left. Adrenal glands and urinary bladder unremarkable. Stomach/Bowel: Normal appendix. Stomach, large and small bowel grossly unremarkable. Vascular/Lymphatic: Aortic atherosclerosis. No evidence of aneurysm or adenopathy. Reproductive: No visible focal abnormality. Other: No free fluid or free air. Musculoskeletal: No acute bony abnormality. IMPRESSION: 4-5 mm right UVJ stone with mild right hydronephrosis. Bilateral nephrolithiasis. Electronically Signed   By: Charlett Nose M.D.   On: 12/06/2021 23:53    Procedures Procedures    Medications Ordered in ED Medications  oxyCODONE-acetaminophen (PERCOCET/ROXICET) 5-325 MG per tablet 1 tablet (1 tablet Oral Given 12/06/21 2323)  HYDROmorphone (DILAUDID) injection 0.5 mg (0.5 mg Intravenous Given 12/07/21 0041)  ondansetron (ZOFRAN) injection 4 mg (4 mg Intravenous Given 12/07/21 0042)  ketorolac (TORADOL) 15 MG/ML injection 15 mg (15 mg Intravenous Given 12/07/21 0041)  lactated ringers bolus 1,000 mL (0 mLs Intravenous Stopped 12/07/21 0106)    ED Course/ Medical Decision Making/ A&P                           Medical Decision Making  46 year old male presents  with known right ureteral stone now with worsening pain radiating into the groin.  Vital signs are normal intake.  Patient very uncomfortable appearing, cardiopulmonary exam is normal, abdominal exam with tenderness to palpation in the suprapubic area as well as right CVAT.  GU exam deferred per patient but he denies any swelling or pain in his penis or testicles.  Amount and/or Complexity of Data Reviewed Labs: ordered.    Details: CBC with Mild leukocytosis 11.5 but no anemia.  CMP unremarkable, UA with hemoglobinuria, but not concerning for infection. Radiology: ordered.    Details: CT renal study revealed movement of previously seen 4 mm distal right ureteral stone now down to the right UVJ, mild right hydronephrosis  Risk Prescription drug management.     Patient reevaluated after fluids and IV analgesia with significant improvement in his pain. Now tolerating PO without difficulty.   NO indication for emergen urologic consult. Has flomax at home. Discharged with refill of pain medication. Recommend close outpatient urology followup.   No further workup warranted in the ED or emergent condition requiring admission at this time.  Tammy Sours and his fiance  voiced understanding of his medical evaluation and treatment plan. Each of their questions answered to their expressed satisfaction.  Return precautions were given.  Patient is well-appearing, stable, and was discharged in good condition.  This chart was dictated using voice recognition software, Dragon. Despite the best efforts of this provider to proofread and correct errors, errors may still occur which can change documentation meaning.   Final Clinical Impression(s) / ED Diagnoses Final diagnoses:  Ureteral stone with hydronephrosis    Rx / DC Orders ED Discharge Orders          Ordered    oxyCODONE-acetaminophen (PERCOCET/ROXICET) 5-325 MG tablet  Every 6 hours PRN        12/07/21 0359              Maurine Mowbray,  Eugene Gavia, PA-C 12/07/21 7035    Marily Memos, MD 12/07/21 [phone]

## 2021-12-08 ENCOUNTER — Telehealth: Payer: Self-pay

## 2021-12-08 ENCOUNTER — Telehealth: Payer: Self-pay | Admitting: General Practice

## 2021-12-08 NOTE — Telephone Encounter (Signed)
Pt lvm stating he has passed the stone. Asking if he should keep Urology appt.   Per Dr. Ashley Royalty, pt should keep appt. LVM advising pt of the same.

## 2021-12-08 NOTE — Telephone Encounter (Signed)
Transition Care Management Follow-up Telephone Call Date of discharge and from where: 12/07/2021 How have you been since you were released from the hospital? Still in a lot of pain. Very frustrated. Patient had an appt for July to see the urologist Scheduled to see Dr. Edison Nasuti. However patient states he is in severe pain and it has been going for a month. I called their office and got him scheduled to see the NP on 12/09/21 @ 1115. Any questions or concerns? No  Items Reviewed: Did the pt receive and understand the discharge instructions provided? Yes  Medications obtained and verified? No  Other? No  Any new allergies since your discharge? No  Dietary orders reviewed? Yes Do you have support at home? Yes   Home Care and Equipment/Supplies: Were home health services ordered? no  Functional Questionnaire: (I = Independent and D = Dependent) ADLs: I  Bathing/Dressing- I  Meal Prep- I  Eating- I  Maintaining continence- I  Transferring/Ambulation- I  Managing Meds- I  Follow up appointments reviewed:  PCP Hospital f/u appt confirmed? No   Specialist Hospital f/u appt confirmed?  Patient had an appt for July to see the urologist  Scheduled to see Dr. Edison Nasuti. However patient states he is in severe pain and it has been going for a month. I called their office and got him scheduled to see the NP on 12/09/21 @ 1115. Are transportation arrangements needed? No  If their condition worsens, is the pt aware to call PCP or go to the Emergency Dept.? Yes Was the patient provided with contact information for the PCP's office or ED? Yes Was to pt encouraged to call back with questions or concerns? Yes

## 2022-09-09 ENCOUNTER — Ambulatory Visit (INDEPENDENT_AMBULATORY_CARE_PROVIDER_SITE_OTHER): Payer: BLUE CROSS/BLUE SHIELD | Admitting: Mental Health

## 2022-09-09 DIAGNOSIS — F332 Major depressive disorder, recurrent severe without psychotic features: Secondary | ICD-10-CM | POA: Insufficient documentation

## 2022-09-09 DIAGNOSIS — F411 Generalized anxiety disorder: Secondary | ICD-10-CM
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
## 2022-09-10 ENCOUNTER — Encounter (HOSPITAL_COMMUNITY): Payer: Self-pay | Admitting: Psychiatry

## 2022-09-10 ENCOUNTER — Ambulatory Visit (INDEPENDENT_AMBULATORY_CARE_PROVIDER_SITE_OTHER): Payer: BLUE CROSS/BLUE SHIELD | Admitting: Psychiatry

## 2022-09-10 VITALS — BP 116/75 | HR 62

## 2022-09-10 DIAGNOSIS — F332 Major depressive disorder, recurrent severe without psychotic features: Secondary | ICD-10-CM | POA: Diagnosis not present

## 2022-09-10 DIAGNOSIS — Z758 Other problems related to medical facilities and other health care: Secondary | ICD-10-CM | POA: Diagnosis not present

## 2022-09-10 MED ORDER — SERTRALINE HCL 100 MG PO TABS: 100.0000 mg | ORAL_TABLET | Freq: Every day | ORAL | 1 refills | Status: DC

## 2022-09-10 MED ORDER — TRAZODONE HCL 50 MG PO TABS: 50.0000 mg | ORAL_TABLET | Freq: Every evening | ORAL | 1 refills | Status: DC | PRN
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
## 2022-09-29 ENCOUNTER — Encounter (HOSPITAL_COMMUNITY): Payer: Self-pay

## 2022-09-29 ENCOUNTER — Ambulatory Visit (INDEPENDENT_AMBULATORY_CARE_PROVIDER_SITE_OTHER): Payer: Self-pay | Admitting: Clinical

## 2022-09-29 ENCOUNTER — Telehealth (HOSPITAL_COMMUNITY): Payer: Self-pay | Admitting: Psychiatry

## 2022-09-29 DIAGNOSIS — F332 Major depressive disorder, recurrent severe without psychotic features: Secondary | ICD-10-CM

## 2022-09-29 NOTE — Progress Notes (Signed)
THERAPIST PROGRESS NOTE  Session Time: 56 minutes  Participation Level: Active  Behavioral Response: CasualAlertDepressed  Type of Therapy: Individual Therapy  Treatment Goals addressed: client will identify cognitive patterns and beliefs that support depression 1x per session  ProgressTowards Goals: Initial  Interventions: CBT and Supportive  Summary:  Vail Armor is a 47 y.o. male who presents as a walk-in to engage with a counselor for outpatient therapy at [facility].  Client presented to the appointment oriented x 5, appropriately dressed, and friendly.  Client denied hallucinations and delusions. Client was originally assessed by an outpatient counselor at [facility] September 09, 2022.  Client reported he has a history of anxiety and depression.  Client reported his symptoms have progressively become worse over the past year related to psychosocial stressors.  Client reported following his initial assessment he was notified by cousin he was very close to committed suicide on 29 February. Client reported he has been hard to cope with that being the reason he is no longer here as well as not having the money to be able to go visit his family in [place] which is also where his cousin met.  Client reported he was laid off from work in the summer 2023 and was denied unemployment.  Client reported he has since gained employment but it is not enough to cover his in his girlfriend's bills.   Client reported his girlfriend was injured in the summer of 2023 and unable to contribute to bills.  Client reported he has been trying to work as many hours to get them back to a place of being caught up with her bills.  Client reported he had also taken out along to help pay for some bills but it was not enough.  Client reported he has been irritable and not having energy to do anything.  Client reported his girlfriend gets upset with him for things such as not being able to spend as much time with her or if he  needs extra time to get some things done for example.  Client reported he has no savings left as over time he has taking many out of his IRA to give her money for things that she may need as well as additional money.  Client reported she loves her but if they were to break up he would not have anywhere to go as he cannot even afford to go home to visit family.  Client reported he is stressed out and does not know where to start to make improvement with things.  Client reported he does have friends but he has not talked to them.  Client reported he has been isolating himself from everybody. Evidence of progress towards goal:  client reported at least 1 stressor that contributes to his depressive symptoms.   Suicidal/Homicidal: Nowithout intent/plan  Therapist Response:  Therapist began the appointment making introductions and discussing confidentiality. Therapist used CBT to engage using active listening and positive emotional support. Therapist used CBT to ask the client open-ended questions about his mental health history as well as past and/or current stressors that contribute to his symptoms. Therapist used CBT to ask the client about support system. Therapist used CBT to discuss prioritizing needs and taking steps to change each issue. Therapist completed S DOH. Therapist completed updated treatment plan with the client's input. Therapist used CBT ask the client to identify his progress with frequency of use with coping skills with continued practice in his daily activity.  Client was scheduled for next appointment. Therapist addressed questions and concerns.   Plan: Return again in 4 weeks.  Diagnosis: severe recurrent depression without psychotic features  Collaboration of Care: Patient refused AEB none requested by the client.  Patient/Guardian was advised Release of Information must be obtained prior to any record release in order to collaborate their care with an outside provider.  Patient/Guardian was advised if they have not already done so to contact the registration department to sign all necessary forms in order for [place] to release information regarding their care.   Consent: Patient/Guardian gives verbal consent for treatment and assignment of benefits for services provided during this visit. Patient/Guardian expressed understanding and agreed to proceed.   [place], LCSW 09/29/2022

## 2022-09-30 ENCOUNTER — Other Ambulatory Visit: Payer: Self-pay

## 2022-09-30 ENCOUNTER — Ambulatory Visit (INDEPENDENT_AMBULATORY_CARE_PROVIDER_SITE_OTHER): Payer: Self-pay | Admitting: Student

## 2022-09-30 DIAGNOSIS — F332 Major depressive disorder, recurrent severe without psychotic features: Secondary | ICD-10-CM

## 2022-09-30 MED ORDER — TRAZODONE HCL 100 MG PO TABS
100.0000 mg | ORAL_TABLET | Freq: Every evening | ORAL | 1 refills | Status: DC | PRN
  Filled 2022-09-30: qty 30, 30d supply, fill #0
  Filled 2022-11-05: qty 30, 30d supply, fill #1

## 2022-09-30 MED ORDER — SERTRALINE HCL 100 MG PO TABS
100.0000 mg | ORAL_TABLET | Freq: Every day | ORAL | 1 refills | Status: DC
  Filled 2022-09-30: qty 30, 30d supply, fill #0
  Filled 2022-11-05: qty 30, 30d supply, fill #1

## 2022-09-30 NOTE — Progress Notes (Signed)
[facility] MD Outpatient Progress Note  10/02/2022 3:07 PM Adam Norton  MRN:  [id number]  Assessment:  [name] presents for follow-up evaluation in-person. Today, 10/02/22, patient reports concern that he continues to have poor sleep.  He will is agreeable to increasing trazodone to better address this.  He reports overall he is less depressed but does feel that his stressors are very similar to before on top of cousin who committed suicide 2 weeks ago.  He would like to continue on same dose of Zoloft.   Identifying Information: Patient is a 47 year old male with past psychiatric history of MDD and medical history of kidney stones and umbilical hernia who is an established patient with [facility] for medication management.   Plan: Major depressive disorder-recurrent episode, severe without psychosis Past medication trials:  Status of problem: stable Interventions: -- Continue Zoloft 100 mg daily for depressive symptoms -- Increase trazodone to 100 mg nightly as needed for sleep -- Continue psychotherapy  Patient was given contact information for behavioral health clinic and was instructed to call 911 for emergencies.   Subjective:  Chief Complaint:  Chief Complaint  Patient presents with   Depression    Interval History:  Patient reports that depression and anxiety symptoms are somewhat improved.  He continues to have stressors regarding finances as well as relationship stressors.  He does report additional stressor of cousin who committed suicide 2 weeks ago.  He denies present SI/HI/AVH.  He is able to contract for safety today.  He reports continued poor sleep but appetite has improved.  He is agreeable to increasing his trazodone to 100 mg nightly as needed which was his initial dose that he had previously been on.  He is working closely with therapist in order to work on stressors and management of depressive symptoms.  He reports his appetite has still been  appropriate but continues to lose weight.  I discussed having him follow-up with primary care doctor so that they can do appropriate studies.  Visit Diagnosis:    ICD-10-CM   1. Severe recurrent major depression without psychotic features (HCC)  F33.2 sertraline (ZOLOFT) 100 MG tablet    traZODone (DESYREL) 100 MG tablet       Past Medical History:  Past Medical History:  Diagnosis Date   Kidney stone     Past Surgical History:  Procedure Laterality Date   KIDNEY STONE SURGERY      Family History:  Family History  Problem Relation Age of Onset   Alcohol abuse Father    Diabetes Father    Cancer Father    Alcohol abuse Brother     Social History:  Social History   Socioeconomic History   Marital status: Single    Spouse name: Not on file   Number of children: Not on file   Years of education: Not on file   Highest education level: Not on file  Occupational History   Not on file  Tobacco Use   Smoking status: Every Day    Packs/day: 1    Types: Cigarettes   Smokeless tobacco: Never  Vaping Use   Vaping Use: Never used  Substance and Sexual Activity   Alcohol use: Not Currently    Alcohol/week: 0.0 standard drinks of alcohol   Drug use: No   Sexual activity: Not on file  Other Topics Concern   Not on file  Social History Narrative   Not on file   Social Determinants of Health   Financial  Resource Strain: High Risk (09/09/2022)   Overall Financial Resource Strain (CARDIA)    Difficulty of Paying Living Expenses: Very hard  Food Insecurity: Food Insecurity Present (09/09/2022)   Hunger Vital Sign    Worried About Running Out of Food in the Last Year: Sometimes true    Ran Out of Food in the Last Year: Sometimes true  Transportation Needs: No Transportation Needs (09/09/2022)   PRAPARE - Hydrologist (Medical): No    Lack of Transportation (Non-Medical): No  Physical Activity: Inactive (09/09/2022)   Exercise Vital Sign    Days  of Exercise per Week: 0 days    Minutes of Exercise per Session: 0 min  Stress: Stress Concern Present (09/09/2022)   [place]    Feeling of Stress : Rather much  Social Connections: Moderately Isolated (09/09/2022)   Social Connection and Isolation Panel [NHANES]    Frequency of Communication with Friends and Family: Once a week    Frequency of Social Gatherings with Friends and Family: Never    Attends Religious Services: 1 to 4 times per year    Active Member of [employer] or Organizations: No    Attends Archivist Meetings: Never    Marital Status: Living with partner    Allergies: No Known Allergies  Current Medications: Current Outpatient Medications  Medication Sig Dispense Refill   albuterol (VENTOLIN HFA) 108 (90 Base) MCG/ACT inhaler Inhale 2 puffs into the lungs every 6 (six) hours as needed for wheezing. 2 each 11   benzonatate (TESSALON) 200 MG capsule Take 1 capsule (200 mg total) by mouth 2 (two) times daily as needed for cough. 20 capsule 0   meloxicam (MOBIC) 15 MG tablet Take 1 tablet (15 mg total) by mouth daily. 30 tablet 0   ondansetron (ZOFRAN) 4 MG tablet Take 1 tablet (4 mg total) by mouth every 8 (eight) hours as needed for nausea or vomiting. 20 tablet 0   oxyCODONE-acetaminophen (PERCOCET/ROXICET) 5-325 MG tablet Take 1 tablet by mouth every 6 (six) hours as needed for severe pain. 15 tablet 0   sertraline (ZOLOFT) 100 MG tablet Take 1 tablet (100 mg total) by mouth daily. 30 tablet 1   tamsulosin (FLOMAX) 0.4 MG CAPS capsule Take 1 capsule (0.4 mg total) by mouth daily. 30 capsule 0   traZODone (DESYREL) 100 MG tablet Take 1 tablet (100 mg total) by mouth at bedtime as needed for sleep. 30 tablet 1   No current facility-administered medications for this visit.    ROS: Review of Systems  Objective:  Psychiatric Specialty Exam: Blood pressure 110/62, pulse 85, weight 123 lb (55.8  kg).Body mass index is 18.7 kg/m.  General Appearance: Casual and Disheveled  Eye Contact:  Fair  Speech:  Clear and Coherent and Normal Rate  Volume:  Normal  Mood:  Depressed  Affect:  Appropriate, Congruent, and Depressed  Thought Process:  Coherent, Goal Directed, and Linear  Orientation:  Full (Time, Place, and Person)  Thought Content: Logical   Suicidal Thoughts:  No  Homicidal Thoughts:  No  Memory:  Remote;   Good  Judgment:  Fair  Insight:  Fair  Psychomotor Activity:  Normal  Concentration:  Concentration: Good and Attention Span: Good              Assets:  Communication Skills Desire for Improvement Financial Resources/Insurance Housing Intimacy Leisure Time Physical Health Resilience Social Support Talents/Skills Transportation Vocational/Educational  ADL's:  Intact  Cognition: WNL  Sleep:  Good   PE: General: well-appearing; no acute distress  Pulm: no increased work of breathing on room air  Strength & Muscle Tone: within normal limits Neuro: no focal neurological deficits observed  Gait & Station: normal  Metabolic Disorder Labs: No results found for: "HGBA1C", "MPG" No results found for: "PROLACTIN" Lab Results  Component Value Date   CHOL 217 (H) 08/27/2016   TRIG 117 08/27/2016   HDL 35 (L) 08/27/2016   CHOLHDL 6.2 (H) 08/27/2016   VLDL 23 08/27/2016   LDLCALC 159 (H) 08/27/2016   LDLCALC 120 08/22/2015   Lab Results  Component Value Date   TSH 0.437 08/22/2015    Therapeutic Level Labs: No results found for: "LITHIUM" No results found for: "VALPROATE" No results found for: "CBMZ"  Screenings: GAD-7    Flowsheet Row Counselor from 09/09/2022 in [facility] Office Visit from 11/17/2021 in [facility] at [place] Visit from 05/28/2020 in [facility] at [place] Visit from 08/22/2019 in [facility] [place] at [place] Visit from 12/15/2018 in [place] at [facility]  Total GAD-7 Score 16 4 0 0 6      PHQ2-9    Flowsheet Row Counselor from 09/09/2022 in [facility] Office Visit from 11/17/2021 in [place] at [place] Visit from 12/23/2020 in [place] at [place] Visit from 05/28/2020 in [place] at [place] from 08/22/2019 in [place] at [facility]  PHQ-2 Total Score 3 1 0 1 2  PHQ-9 Total Score 7 1 0 2 3      Flowsheet Row Counselor from 09/09/2022 in [facility] ED from 12/06/2021 in [facility] Emergency Department at [facility] ED from 11/08/2021 in [facility] Emergency Department at [place] No Risk No Risk No Risk       Collaboration of Care: Collaboration of Care:   Patient/Guardian was advised Release of Information must be obtained prior to any record release in order to collaborate their care with an outside provider. Patient/Guardian was advised if they have not already done so to contact the registration department to sign all necessary forms in order for [place] to release information regarding their care.   Consent: Patient/Guardian gives verbal consent for treatment and assignment of benefits for services provided during this visit. Patient/Guardian expressed understanding and agreed to proceed.   A total of 30 minutes was spent involved in face to face clinical care, chart review, and documentation.   France Ravens, MD 10/02/2022, 3:07 PM

## 2022-09-30 NOTE — Patient Instructions (Signed)
Please call to reschedule your PCP appointment: [facility] [place], [place] [address] [phone]

## 2022-10-02 ENCOUNTER — Encounter (HOSPITAL_COMMUNITY): Payer: Self-pay | Admitting: Student

## 2022-10-06 ENCOUNTER — Other Ambulatory Visit: Payer: Self-pay

## 2022-11-01 ENCOUNTER — Ambulatory Visit (INDEPENDENT_AMBULATORY_CARE_PROVIDER_SITE_OTHER): Payer: No Payment, Other | Admitting: Clinical

## 2022-11-01 DIAGNOSIS — F332 Major depressive disorder, recurrent severe without psychotic features: Secondary | ICD-10-CM | POA: Diagnosis not present

## 2022-11-01 NOTE — Progress Notes (Signed)
   THERAPIST PROGRESS NOTE  Session Time: 60 minutes  Participation Level: Active  Behavioral Response: CasualAlertDepressed  Type of Therapy: Individual Therapy  Treatment Goals addressed: client will identify cognitive patterns and beliefs that support depression 1x per session  ProgressTowards Goals: Progressing  Interventions: CBT and Supportive  Summary:  Adam Norton is a 47 y.o. male who presents for the scheduled appointment oriented times five, appropriately dressed and friendly. Client denied hallucinations and delusions. Client reported on today he is having mixed emotions. Client reported he and his girlfriend have not been arguing as much. Client reported they had some conversations that went good and not so good. Client reported he thinks she is starting to get some idea of what he needs as far as support while he is trying to improve their financial situation. Client reported he does still have thoughts worrying about his future with their relationship. Client reported he is worried abut something else bad happening such as with his family and he can't travel back home or his girlfriends house being foreclosed on because he can't pay the property taxes. Client reported his girlfriend is still recovering from surgery and has upcoming procedures to have done which keeps her from being able to contribute financially. Client reported he feels like he is working for free. 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  Evidence of progress towards goal:  client reported practicing at least 1 skill which is problem solving frequently during the week.   Suicidal/Homicidal: Nowithout intent/plan  Therapist Response:  Therapist began the appointment asking the client how he has been doing since last seen. Therapist used CBT to engage using active listening and positive emotional support. Therapist  used CBT to engage and ask the client to discuss any progression with his psychosocial stressors. Therapist used CBT to ask the client open ended questions about steps he is taking to improve his finances and employment. Therapist used CBT to positively reinforce the client ability to problem solve and prioritize his problems. Therapist used CBT ask the client to identify his progress with frequency of use with coping skills with continued practice in his daily activity.     Therapist assigned the client homework to call the places of employment and use a social support person to help him fix his resume.  Plan: Return again in 3 weeks.  Diagnosis: severe recurrent major depression without psychotic features  Collaboration of Care: Patient refused AEB none requested by the client.  Patient/Guardian was advised Release of Information must be obtained prior to any record release in order to collaborate their care with an outside provider. Patient/Guardian was advised if they have not already done so to contact the registration department to sign all necessary forms in order for [place] to release information regarding their care.   Consent: Patient/Guardian gives verbal consent for treatment and assignment of benefits for services provided during this visit. Patient/Guardian expressed understanding and agreed to proceed.   Adam Norton Adam Humber, LCSW 11/01/2022

## 2022-11-02 ENCOUNTER — Ambulatory Visit (HOSPITAL_COMMUNITY)
Admission: RE | Admit: 2022-11-02 | Discharge: 2022-11-02 | Disposition: A | Discharge status: Home / Self Care | Payer: BLUE CROSS/BLUE SHIELD | Source: Ambulatory Visit | Attending: Nurse Practitioner | Admitting: Nurse Practitioner

## 2022-11-02 ENCOUNTER — Ambulatory Visit (INDEPENDENT_AMBULATORY_CARE_PROVIDER_SITE_OTHER): Payer: Self-pay | Admitting: Nurse Practitioner

## 2022-11-02 ENCOUNTER — Encounter: Payer: Self-pay | Admitting: Nurse Practitioner

## 2022-11-02 VITALS — BP 109/68 | HR 63 | Temp 98.0°F | Ht 68.0 in | Wt 125.0 lb

## 2022-11-02 DIAGNOSIS — Z716 Tobacco abuse counseling: Secondary | ICD-10-CM

## 2022-11-02 DIAGNOSIS — Z131 Encounter for screening for diabetes mellitus: Secondary | ICD-10-CM | POA: Insufficient documentation

## 2022-11-02 DIAGNOSIS — R634 Abnormal weight loss: Secondary | ICD-10-CM | POA: Insufficient documentation

## 2022-11-02 DIAGNOSIS — F332 Major depressive disorder, recurrent severe without psychotic features: Secondary | ICD-10-CM

## 2022-11-02 DIAGNOSIS — Z1211 Encounter for screening for malignant neoplasm of colon: Secondary | ICD-10-CM

## 2022-11-02 LAB — POCT GLYCOSYLATED HEMOGLOBIN (HGB A1C): Hemoglobin A1C: 5.5 % (ref 4.0–5.6)

## 2022-11-02 LAB — POCT URINALYSIS DIP (CLINITEK)
Bilirubin, UA: NEGATIVE
Glucose, UA: NEGATIVE mg/dL
Ketones, POC UA: NEGATIVE mg/dL
Leukocytes, UA: NEGATIVE
Nitrite, UA: NEGATIVE
POC PROTEIN,UA: NEGATIVE
Spec Grav, UA: >=1.03 — AB (ref 1.010–1.025)
Urobilinogen, UA: 0.2 E.U./dL
pH, UA: 5.5 (ref 5.0–8.0)
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
## 2022-11-02 NOTE — Assessment & Plan Note (Addendum)
Screen for diabetes obtain a chest x-ray, current smoker - CBC with Differential - CMP14+EGFR - TSH - Hepatitis C antibody - HIV antibody (with reflex) - DG Chest 2 View - POCT URINALYSIS DIP (CLINITEK) Will refer to oncology or GI specialist if needed. UA shows trace intact blood Wt Readings from Last 3 Encounters:  11/02/22 125 lb (56.7 kg)  09/30/22 123 lb (55.8 kg)  11/17/21 130 lb 9.6 oz (59.2 kg)  Patient appears to have lost about 25 pounds in 2 years has lost only 5 pounds in the past 1 year.

## 2022-11-02 NOTE — Patient Instructions (Signed)
1. Tobacco abuse counseling  - DG Chest 2 View  2. Unintentional weight loss of more than 10 pounds  - CBC with Differential - CMP14+EGFR - TSH - Hepatitis C antibody - HIV antibody (with reflex) - DG Chest 2 View - POCT URINALYSIS DIP (CLINITEK)  3. Screening for diabetes mellitus  - POCT glycosylated hemoglobin (Hb A1C)  4. Screening for colon cancer  - Cologuard  5. Severe recurrent major depression without psychotic features   [facility] we consider it a privelige to serve you.

## 2022-11-02 NOTE — Assessment & Plan Note (Signed)
Smokes about 1 pack/day  Asked about quitting: confirms that he/she currently smokes cigarettes Advise to quit smoking: Educated about QUITTING to reduce the risk of cancer, cardio and cerebrovascular disease. Assess willingness: Unwilling to quit at this time, not working on cutting back. Assist with counseling and pharmacotherapy: Counseled for 5 minutes and literature provided. Arrange for follow up: follow up in 3 months and continue to offer help.

## 2022-11-02 NOTE — Assessment & Plan Note (Signed)
Continue Zoloft 100 mg daily, trazodone 100 mg at bedtime as needed Patient encouraged to maintain close follow-up with therapy and psychiatrist Flowsheet Row Office Visit from 11/02/2022 in [facility]  PHQ-9 Total Score 8

## 2022-11-02 NOTE — Assessment & Plan Note (Signed)
Lab Results  Component Value Date   HGBA1C 5.5 11/02/2022

## 2022-11-03 ENCOUNTER — Other Ambulatory Visit: Payer: Self-pay | Admitting: Nurse Practitioner

## 2022-11-03 DIAGNOSIS — R7401 Elevation of levels of liver transaminase levels: Secondary | ICD-10-CM

## 2022-11-03 LAB — TSH: TSH: 0.645 u[IU]/mL (ref 0.450–4.500)

## 2022-11-03 LAB — CBC WITH DIFFERENTIAL/PLATELET
Basophils Absolute: 0 10*3/uL (ref 0.0–0.2)
Basos: 0 %
EOS (ABSOLUTE): 0.2 10*3/uL (ref 0.0–0.4)
Eos: 2 %
Hematocrit: 42 % (ref 37.5–51.0)
Hemoglobin: 13.6 g/dL (ref 13.0–17.7)
Immature Grans (Abs): 0 10*3/uL (ref 0.0–0.1)
Immature Granulocytes: 0 %
Lymphocytes Absolute: 1.9 10*3/uL (ref 0.7–3.1)
Lymphs: 21 %
MCH: 29.2 pg (ref 26.6–33.0)
MCHC: 32.4 g/dL (ref 31.5–35.7)
MCV: 90 fL (ref 79–97)
Monocytes Absolute: 0.8 10*3/uL (ref 0.1–0.9)
Monocytes: 9 %
Neutrophils Absolute: 6.1 10*3/uL (ref 1.4–7.0)
Neutrophils: 68 %
Platelets: 344 10*3/uL (ref 150–450)
RBC: 4.66 x10E6/uL (ref 4.14–5.80)
RDW: 13.5 % (ref 11.6–15.4)
WBC: 8.9 10*3/uL (ref 3.4–10.8)

## 2022-11-03 LAB — CMP14+EGFR
ALT: 21 IU/L (ref 0–44)
AST: 15 IU/L (ref 0–40)
Albumin/Globulin Ratio: 1.4 (ref 1.2–2.2)
Albumin: 4 g/dL — ABNORMAL LOW (ref 4.1–5.1)
Alkaline Phosphatase: 180 IU/L — ABNORMAL HIGH (ref 44–121)
BUN/Creatinine Ratio: 27 — ABNORMAL HIGH (ref 9–20)
BUN: 20 mg/dL (ref 6–24)
Bilirubin Total: 0.2 mg/dL (ref 0.0–1.2)
CO2: 21 mmol/L (ref 20–29)
Calcium: 9.4 mg/dL (ref 8.7–10.2)
Chloride: 106 mmol/L (ref 96–106)
Creatinine, Ser: 0.75 mg/dL — ABNORMAL LOW (ref 0.76–1.27)
Globulin, Total: 2.9 g/dL (ref 1.5–4.5)
Glucose: 81 mg/dL (ref 70–99)
Potassium: 4.4 mmol/L (ref 3.5–5.2)
Sodium: 140 mmol/L (ref 134–144)
Total Protein: 6.9 g/dL (ref 6.0–8.5)
eGFR: 111 mL/min/{1.73_m2} (ref >59)

## 2022-11-03 LAB — HEPATITIS C ANTIBODY: Hep C Virus Ab: NONREACTIVE

## 2022-11-03 LAB — HIV ANTIBODY (ROUTINE TESTING W REFLEX): HIV Screen 4th Generation wRfx: NONREACTIVE

## 2022-11-05 ENCOUNTER — Other Ambulatory Visit: Payer: Self-pay

## 2022-11-08 ENCOUNTER — Other Ambulatory Visit: Payer: Self-pay | Admitting: Nurse Practitioner

## 2022-11-08 ENCOUNTER — Ambulatory Visit (INDEPENDENT_AMBULATORY_CARE_PROVIDER_SITE_OTHER): Payer: No Payment, Other | Admitting: Clinical

## 2022-11-08 ENCOUNTER — Encounter: Payer: Self-pay | Admitting: Physician Assistant

## 2022-11-08 DIAGNOSIS — F332 Major depressive disorder, recurrent severe without psychotic features: Secondary | ICD-10-CM | POA: Diagnosis not present

## 2022-11-08 DIAGNOSIS — R748 Abnormal levels of other serum enzymes: Secondary | ICD-10-CM

## 2022-11-08 DIAGNOSIS — R634 Abnormal weight loss: Secondary | ICD-10-CM

## 2022-11-08 NOTE — Progress Notes (Signed)
Pt has seen in my chart. Gh 

## 2022-11-08 NOTE — Progress Notes (Signed)
   THERAPIST PROGRESS NOTE  Session Time: 45 minutes  Participation Level: Active  Behavioral Response: CasualAlertDepressed  Type of Therapy: Individual Therapy  Treatment Goals addressed: client will reduce frequency, intensity and duration of depression symptoms so that daily functioning is improved by self report 1x per session  ProgressTowards Goals: Progressing  Interventions: CBT and Supportive  Summary:  Adam Norton is a 47 y.o. male who presents for the scheduled appointment oriented times five, appropriately dressed, and friendly. Client denied hallucinations and delusions. Client reported on today he is doing fairly okay. Client reported he has another cousin who went missing and is presumed dead. Client reported it is devastating to him to have another loss close to him and he is unable to go home to see his family. Client reported he went to church last Sunday and it is good. Client reported it helps to keep him motivated. Client reported he recently had a argument with his girlfriend because he disclosed information to someone to vent. Client reported he has so much built up and doesn't know what to do in the mean time. Client reported he sees how that can be detrimental because others wont protect what he is sharing to them. Client reported he has not heard anything back about  a job but will go in person today to do some applications. Client reported he knows a friend who will help him fix his resume. Evidence of progress towards goal:  client reported 1 positive of having social support through church to help improve his mood. Client reported at least 4 days out of the week he is spending time trying to positively change his employment situation.  Suicidal/Homicidal: Nowithout intent/plan  Therapist Response:  Therapist began the appointment asking the client how he has been doing. Therapist used CBT to engage using active listening and positive emotional  support. Therapist used CBT to ask the client to discuss how he has continued to work with coping with his depressive symptoms. Therapist used CBT to positively reinforce the clients problems solving skills. Therapist used CBT ask the client to identify his progress with frequency of use with coping skills with continued practice in his daily activity.    Therapist assigned the client homework to continue using social support and working on putting in at least 3 applications per week.   Plan: Return again in 3 weeks.  Diagnosis: severe recurrent major depression without psychotic features  Collaboration of Care: Patient refused AEB none requested by the client.  Patient/Guardian was advised Release of Information must be obtained prior to any record release in order to collaborate their care with an outside provider. Patient/Guardian was advised if they have not already done so to contact the registration department to sign all necessary forms in order for [place] to release information regarding their care.   Consent: Patient/Guardian gives verbal consent for treatment and assignment of benefits for services provided during this visit. Patient/Guardian expressed understanding and agreed to proceed.   Neena Rhymes [name], LCSW 11/08/2022

## 2022-11-11 ENCOUNTER — Encounter (HOSPITAL_COMMUNITY): Payer: Self-pay | Admitting: Student

## 2022-11-11 ENCOUNTER — Ambulatory Visit (INDEPENDENT_AMBULATORY_CARE_PROVIDER_SITE_OTHER): Payer: No Payment, Other | Admitting: Student

## 2022-11-11 ENCOUNTER — Other Ambulatory Visit: Payer: Self-pay

## 2022-11-11 VITALS — BP 110/71 | HR 81 | Wt 126.6 lb

## 2022-11-11 DIAGNOSIS — F411 Generalized anxiety disorder: Secondary | ICD-10-CM | POA: Diagnosis not present

## 2022-11-11 DIAGNOSIS — F332 Major depressive disorder, recurrent severe without psychotic features: Secondary | ICD-10-CM | POA: Diagnosis not present

## 2022-11-11 DIAGNOSIS — F17210 Nicotine dependence, cigarettes, uncomplicated: Secondary | ICD-10-CM | POA: Diagnosis not present

## 2022-11-11 MED ORDER — TRAZODONE HCL 50 MG PO TABS
125.0000 mg | ORAL_TABLET | Freq: Every evening | ORAL | 2 refills | Status: DC | PRN
  Filled 2022-11-11 – 2022-12-03 (×2): qty 100, 33d supply, fill #0
  Filled 2023-01-11: qty 100, 33d supply, fill #1

## 2022-11-11 MED ORDER — SERTRALINE HCL 100 MG PO TABS
150.0000 mg | ORAL_TABLET | Freq: Every day | ORAL | 2 refills | Status: DC
  Filled 2022-11-11 – 2022-12-03 (×2): qty 45, 30d supply, fill #0
  Filled 2023-01-11: qty 45, 30d supply, fill #1

## 2022-11-11 NOTE — Progress Notes (Addendum)
BH MD Outpatient Progress Note  11/11/2022 5:25 PM Adam Norton  MRN:  [id number]  Assessment:  Adam Norton presents for follow-up evaluation in-person. Today, 11/11/22, patient reports continued feelings of depression and anxiety. Finances have been primary stressor. Plan to increase zoloft and trazodone to better manage symptoms of MDD and insomnia respectively. Referral to case manager has also been put in so patient may be able to apply for medicaid or financial resources.   Labs reviewed. Given Gamma GT was normal with elevated Alk Phos, recommend PTH and vitamin D levels to assess for bone disorder.   Identifying Information: Patient is a 47 year old male with past psychiatric history of MDD and medical history of kidney stones and umbilical hernia who is an established patient with [facility] for medication management.   Plan: Major depressive disorder-recurrent episode, severe without psychosis Past medication trials:  Status of problem: stable Interventions: -- Increase Zoloft to 150 mg and switch to nightly for depressive symptoms -- Increase trazodone to 125-150 mg nightly as needed for sleep -- Continue psychotherapy -- Referral to case manager for medicaid and any other financial resources  Generalized Anxiety Disorder -zoloft and psychotherapy as above  Nicotine Use Disorder Status: precontemplative -Recommend cessation -CXR no acute cardiopulmonary disease   Patient was given contact information for behavioral health clinic and was instructed to call 911 for emergencies.   Subjective:  Chief Complaint:  Chief Complaint  Patient presents with   Anxiety   Depression    Interval History:  Things have been "going". Reports continuing to feel depressed and anxious.  He reports primary stressor has been finances given he needs to pay for property tax so that where he lives will be foreclosed. This has been extraordinarily stressful for him.   He has been getting to some fights with girlfriend as well as having problems with sleep (~4 hours per night).  He continues to work at the [employer] until he is able to find full-time employment.  He has applied for Medicaid but he is not concerned that he will not be excepted.  He has been working weekends in order to increase amount of money that he makes but he states that this is not sufficient to cover his current financial expenses.  He denies present SI/HI/AVH.  He does report that he feels that things have been getting harder and harder for him.  He does report that the Zoloft continues to be mildly helpful with regards to his depression and anxiety but reports that the trazodone has not been as helpful recently.  We discussed increasing the Zoloft to 150 mg daily and increasing the trazodone to 125-150 mg daily.  He was agreeable with this plan.  He agreed to be referred to case manager to see if this would help with financial resources or applying for Medicaid.  He states that he is waiting to hear back from a ministry out reach program to see if they would be able to assist financially.  He reports that he has been going to see his therapist and that has been somewhat helpful.  He reports that his appetite has been unchanged.  Visit Diagnosis:    ICD-10-CM   1. Generalized anxiety disorder  F41.1     2. Cigarette nicotine dependence without complication  F17.210     3. Severe recurrent major depression without psychotic features  F33.2 sertraline (ZOLOFT) 100 MG tablet    traZODone (DESYREL) 50 MG tablet     Past  Psychiatric Hx:  Previous Psych Diagnoses: MDD, history of cannabis abuse, nicotine dependence Prior inpatient treatment: Denies Current meds: None Psychotherapy hx: Got psychotherapy for 8 months in 2016 Previous suicidal attempts: Denies Previous medication trials: Zoloft 100 mg daily and trazodone 100 mg nightly Current therapist: Started therapy with Armed forces technical officer  Past  Medical History:  Past Medical History:  Diagnosis Date   Alcohol abuse    Depression    Kidney stone    Kidney stone    Tobacco abuse     Past Surgical History:  Procedure Laterality Date   KIDNEY STONE SURGERY      Family History:  Family History  Problem Relation Age of Onset   Alcohol abuse Father    Diabetes Father    Cancer Father        pancreatic cancer   Alcohol abuse Brother    Colon cancer Neg Hx    Prostate cancer Neg Hx     Social History:  Social History   Socioeconomic History   Marital status: Single    Spouse name: Not on file   Number of children: Not on file   Years of education: Not on file   Highest education level: Not on file  Occupational History   Not on file  Tobacco Use   Smoking status: Every Day    Packs/day: 1    Types: Cigarettes   Smokeless tobacco: Never  Vaping Use   Vaping Use: Never used  Substance and Sexual Activity   Alcohol use: Not Currently    Comment: quit drinking in 2016.   Drug use: No   Sexual activity: Yes  Other Topics Concern   Not on file  Social History Narrative   Lives with his girlfriend   Social Determinants of Health   Financial Resource Strain: High Risk (09/09/2022)   Overall Financial Resource Strain (CARDIA)    Difficulty of Paying Living Expenses: Very hard  Food Insecurity: Food Insecurity Present (09/09/2022)   Hunger Vital Sign    Worried About Running Out of Food in the Last Year: Sometimes true    Ran Out of Food in the Last Year: Sometimes true  Transportation Needs: No Transportation Needs (09/09/2022)   PRAPARE - Administrator, Civil Service (Medical): No    Lack of Transportation (Non-Medical): No  Physical Activity: Inactive (09/09/2022)   Exercise Vital Sign    Days of Exercise per Week: 0 days    Minutes of Exercise per Session: 0 min  Stress: Stress Concern Present (09/09/2022)   [employer] of Occupational Health - Occupational Stress Questionnaire     Feeling of Stress : Rather much  Social Connections: Moderately Isolated (09/09/2022)   Social Connection and Isolation Panel [NHANES]    Frequency of Communication with Friends and Family: Once a week    Frequency of Social Gatherings with Friends and Family: Never    Attends Religious Services: 1 to 4 times per year    Active Member of [employer] or Organizations: No    Attends Banker Meetings: Never    Marital Status: Living with partner    Allergies:  Allergies  Allergen Reactions   Seasonal Ic [Octacosanol]     Current Medications: Current Outpatient Medications  Medication Sig Dispense Refill   albuterol (VENTOLIN HFA) 108 (90 Base) MCG/ACT inhaler Inhale 2 puffs into the lungs every 6 (six) hours as needed for wheezing. (Patient not taking: Reported on 11/02/2022) 2 each 11   meloxicam (MOBIC)  15 MG tablet Take 1 tablet (15 mg total) by mouth daily. (Patient not taking: Reported on 11/02/2022) 30 tablet 0   sertraline (ZOLOFT) 100 MG tablet Take 1.5 tablets (150 mg total) by mouth at bedtime. 45 tablet 2   tamsulosin (FLOMAX) 0.4 MG CAPS capsule Take 1 capsule (0.4 mg total) by mouth daily. (Patient not taking: Reported on 11/02/2022) 30 capsule 0   traZODone (DESYREL) 50 MG tablet Take 2.5-3 tablets (125-150 mg total) by mouth at bedtime as needed for sleep. 100 tablet 2   No current facility-administered medications for this visit.    ROS: Review of Systems  Objective:  Psychiatric Specialty Exam: Blood pressure 110/71, pulse 81, weight 126 lb 9.6 oz (57.4 kg), SpO2 93 %.Body mass index is 19.25 kg/m.  General Appearance: Casual  Eye Contact:  Fair  Speech:  Clear and Coherent and Normal Rate  Volume:  Normal  Mood:  Anxious  Affect:  Appropriate and Congruent  Thought Process:  Coherent, Goal Directed, and Linear  Orientation:  Full (Time, Place, and Person)  Thought Content: Logical   Suicidal Thoughts:  No  Homicidal Thoughts:  No  Memory:  Remote;    Good  Judgment:  Good  Insight:  Good  Psychomotor Activity:  Normal  Concentration:  Concentration: Fair and Attention Span: Fair              Assets:  Communication Skills Desire for Improvement Housing Leisure Time Physical Health Resilience Social Support Talents/Skills Transportation Vocational/Educational  ADL's:  Intact  Cognition: WNL  Sleep:  Poor   PE: General: well-appearing; no acute distress  Pulm: no increased work of breathing on room air  Strength & Muscle Tone: within normal limits Neuro: no focal neurological deficits observed  Gait & Station: normal  Metabolic Disorder Labs: Lab Results  Component Value Date   HGBA1C 5.5 11/02/2022   No results found for: "PROLACTIN" Lab Results  Component Value Date   CHOL 217 (H) 08/27/2016   TRIG 117 08/27/2016   HDL 35 (L) 08/27/2016   CHOLHDL 6.2 (H) 08/27/2016   VLDL 23 08/27/2016   LDLCALC 159 (H) 08/27/2016   LDLCALC 120 08/22/2015   Lab Results  Component Value Date   TSH 0.645 11/02/2022   TSH 0.437 08/22/2015    Therapeutic Level Labs: No results found for: "LITHIUM" No results found for: "VALPROATE" No results found for: "CBMZ"  Screenings: GAD-7    Flowsheet Row Counselor from 09/09/2022 in [facility] Office Visit from 11/17/2021 in [facility] & Sports Medicine at [facility] Office Visit from 05/28/2020 in [facility] Care & [facility] Medicine at [facility] Office Visit from 08/22/2019 in [facility] Primary Care & Sports Medicine at [facility] Office Visit from 12/15/2018 in [facility] Primary Care & [facility] at [facility]  Total GAD-7 Score 16 4 0 0 6      PHQ2-9    Flowsheet Row Office Visit from 11/02/2022 in [facility] Counselor from 09/09/2022 in [facility] Office Visit from 11/17/2021 in [facility] Primary Care & Sports Medicine at  [facility] Office Visit from 12/23/2020 in [facility] Primary Care & Sports Medicine at [facility] Office Visit from 05/28/2020 in [facility] Primary Care & Sports Medicine at [facility]  PHQ-2 Total Score 4 3 1  0 1  PHQ-9 Total Score 8 7 1  0 2      Flowsheet Row Counselor  from 09/09/2022 in [facility] ED from 12/06/2021 in [facility] Department at [facility] ED from 11/08/2021 in [facility] Emergency Department at [facility]  C-SSRS RISK CATEGORY No Risk No Risk No Risk       Collaboration of Care: Collaboration of Care:  Patient/Guardian was advised Release of Information must be obtained prior to any record release in order to collaborate their care with an outside provider. Patient/Guardian was advised if they have not already done so to contact the registration department to sign all necessary forms in order for [place] to release information regarding their care.   Consent: Patient/Guardian gives verbal consent for treatment and assignment of benefits for services provided during this visit. Patient/Guardian expressed understanding and agreed to proceed.   A total of 40 minutes was spent involved in face to face clinical care, chart review, and documentation.   Park Pope, MD 11/11/2022, 5:25 PM

## 2022-11-12 ENCOUNTER — Other Ambulatory Visit: Payer: Self-pay

## 2022-11-17 LAB — COLOGUARD: COLOGUARD: NEGATIVE

## 2022-11-18 ENCOUNTER — Other Ambulatory Visit: Payer: Self-pay

## 2022-11-19 ENCOUNTER — Other Ambulatory Visit: Payer: Self-pay

## 2022-11-22 ENCOUNTER — Ambulatory Visit (HOSPITAL_COMMUNITY): Payer: No Payment, Other | Admitting: Clinical

## 2022-11-25 LAB — GAMMA GT: GGT: 33 IU/L (ref 0–65)

## 2022-11-25 LAB — SPECIMEN STATUS REPORT

## 2022-12-03 ENCOUNTER — Other Ambulatory Visit: Payer: Self-pay

## 2022-12-08 ENCOUNTER — Other Ambulatory Visit: Payer: Self-pay

## 2022-12-14 ENCOUNTER — Ambulatory Visit (HOSPITAL_COMMUNITY): Payer: No Payment, Other | Admitting: Clinical

## 2022-12-15 ENCOUNTER — Ambulatory Visit (HOSPITAL_COMMUNITY): Payer: No Payment, Other | Admitting: Clinical

## 2022-12-16 ENCOUNTER — Encounter (HOSPITAL_COMMUNITY): Payer: Self-pay

## 2022-12-28 ENCOUNTER — Ambulatory Visit (INDEPENDENT_AMBULATORY_CARE_PROVIDER_SITE_OTHER): Payer: No Payment, Other | Admitting: Clinical

## 2022-12-28 DIAGNOSIS — F332 Major depressive disorder, recurrent severe without psychotic features: Secondary | ICD-10-CM

## 2022-12-28 NOTE — Progress Notes (Signed)
   THERAPIST PROGRESS NOTE  Session Time: 45 minutes  Participation Level: Active  Behavioral Response: CasualAlertAnxious  Type of Therapy: Individual Therapy  Treatment Goals addressed: client will score less than a 10 on the PHQ9 questionairee  ProgressTowards Goals: Progressing  Interventions: CBT and Supportive  Summary:  Adam Norton is a 47 y.o. male who presents for the scheduled appointment oriented times five, appropriately dressed and friendly. Client denied hallucinations and delusions. Client reported on today he has been doing fairly okay. Client reported he has been trying to keep optimistic mindset. Client reported at some point things will get better. Client reported he has been going to church weekly to keep him uplifted. Client reported things have been okay at home with his girlfriend. Client reported he has been continuing his current job and was told he will get an increase but not sure how much the increase will be. Client reported he is still looking for a job. Client reported his girlfriend sister helped him to construct his resume but he does not have a way to get a hard copy of it. Client reported otherwise he has been worried about a close friend who has an issue with alcohol use. Client reported some anxiety about the friendship because the friend is in denial. Client reported he is trying to figure out how to help his friend. Evidence of progress towards goal:  client PHQ9 score is under 10.  Flowsheet Row Counselor from 12/28/2022 in [facility]  PHQ-9 Total Score 4       Suicidal/Homicidal: Nowithout intent/plan  Therapist Response:  Therapist began the appointment asking the client how he has been doing. Therapist used CBT to engage using active listening and positive emotional support. Therapist used CBT to engage and ask him to describe the severity of his depressive symptoms.  Therapist used CBT to positively reinforced  the clients problem solving and follow through on goals. Therapist used CBT ask the client to identify his progress with frequency of use with coping skills with continued practice in his daily activity.     Therapist assigned the client homework to practice self care and continue looking for work.  Plan: Return again in 4 weeks.  Diagnosis: sever recurrent major depression without psychotic features  Collaboration of Care: Patient refused AEB none requested by the client.  Patient/Guardian was advised Release of Information must be obtained prior to any record release in order to collaborate their care with an outside provider. Patient/Guardian was advised if they have not already done so to contact the registration department to sign all necessary forms in order for [place] to release information regarding their care.   Consent: Patient/Guardian gives verbal consent for treatment and assignment of benefits for services provided during this visit. Patient/Guardian expressed understanding and agreed to proceed.   Neena Rhymes Kamin Niblack, LCSW 12/28/2022

## 2022-12-30 ENCOUNTER — Encounter (HOSPITAL_COMMUNITY): Payer: No Payment, Other | Admitting: Student

## 2023-01-11 ENCOUNTER — Other Ambulatory Visit: Payer: Self-pay

## 2023-01-14 ENCOUNTER — Ambulatory Visit: Payer: Self-pay | Admitting: Physician Assistant

## 2023-01-25 ENCOUNTER — Telehealth (HOSPITAL_COMMUNITY): Payer: Self-pay | Admitting: Student

## 2023-01-25 ENCOUNTER — Other Ambulatory Visit: Payer: Self-pay

## 2023-01-25 ENCOUNTER — Other Ambulatory Visit (HOSPITAL_COMMUNITY): Payer: Self-pay | Admitting: Student

## 2023-01-25 DIAGNOSIS — F332 Major depressive disorder, recurrent severe without psychotic features: Secondary | ICD-10-CM

## 2023-01-25 MED ORDER — TRAZODONE HCL 50 MG PO TABS
125.0000 mg | ORAL_TABLET | Freq: Every evening | ORAL | 0 refills | Status: DC | PRN
  Filled 2023-01-25: qty 20, 6d supply, fill #0

## 2023-01-25 NOTE — Telephone Encounter (Signed)
Notified by front desk patient lost bottle of trazodone. He states he normally leaves it on in the bathroom but was unable to locate it and is unsure if someone may have taken it or it was accidentally thrown out. Will send for an early refill of 20 tablets to cover him until appointment with me 01/27/23.  Park Pope, MD PGY2 Psychiatry Resident

## 2023-01-26 NOTE — Progress Notes (Addendum)
BH MD Outpatient Progress Note  01/27/2023 6:07 PM Render Adam Norton  MRN:  [id number]  Assessment:  Adam Norton presents for follow-up evaluation in-person. Today, 01/27/23, patient reports improvement in depression, anxiety, and sleep. No safety concerns at this time. Tolerating medication regimen well and opts to continue medications as prescribed below.  Identifying Information: Patient is a 47 year old male with past psychiatric history of MDD and medical history of kidney stones and umbilical hernia who is an established patient with [facility] for medication management.   Plan: Major depressive disorder-recurrent episode, severe without psychosis Past medication trials:  Status of problem: improving Interventions: -- Continue zoloft 150 mg and switch to nightly for depressive symptoms -- Continue trazodone 150 mg nightly as needed for sleep -- Continue psychotherapy -- Referral to case manager for medicaid and any other financial resources  Generalized Anxiety Disorder -zoloft and psychotherapy as above  Nicotine Use Disorder Status: precontemplative -Recommend cessation -CXR no acute cardiopulmonary disease  Patient was given contact information for behavioral health clinic and was instructed to call 911 for emergencies.   Subjective:  Chief Complaint:  Chief Complaint  Patient presents with   Medication Management    Interval History:  Things have been "going". He reports while continuing to feel depressed and anxious at times, he does feel things are better than before. The trazodone has greatly aided with his sleep and he states that he feels more well rested on a day-to-day.  His appetite has continued to be stable.  He reports that his partner has gotten into some legal problems which has been stressful to him but he is managing. He continues to work at the [employer] until he is able to find full-time employment and has actually received a  pay raise which has been financially beneficial to him. He denies present SI/HI/AVH.  He reports that given his symptoms have generally improved that he would like to stay on the same doses of his psychotropic medications.  Patient is to follow-up in 2 months.  Visit Diagnosis:    ICD-10-CM   1. Generalized anxiety disorder  F41.1     2. Severe recurrent major depression without psychotic features (HCC)  F33.2 sertraline (ZOLOFT) 100 MG tablet    traZODone (DESYREL) 150 MG tablet     Past Psychiatric Hx:  Previous Psych Diagnoses: MDD, history of cannabis abuse, nicotine dependence Prior inpatient treatment: Denies Current meds: None Psychotherapy hx: Got psychotherapy for 8 months in 2016 Previous suicidal attempts: Denies Previous medication trials: Zoloft 100 mg daily and trazodone 100 mg nightly Current therapist: Started therapy with Armed forces technical officer  Past Medical History:  Past Medical History:  Diagnosis Date   Alcohol abuse    Depression    Kidney stone    Kidney stone    Tobacco abuse     Past Surgical History:  Procedure Laterality Date   KIDNEY STONE SURGERY      Family History:  Family History  Problem Relation Age of Onset   Alcohol abuse Father    Diabetes Father    Cancer Father        pancreatic cancer   Alcohol abuse Brother    Colon cancer Neg Hx    Prostate cancer Neg Hx     Social History:  Social History   Socioeconomic History   Marital status: Single    Spouse name: Not on file   Number of children: Not on file   Years of education: Not on file  Highest education level: Not on file  Occupational History   Not on file  Tobacco Use   Smoking status: Every Day    Current packs/day: 1.00    Types: Cigarettes   Smokeless tobacco: Never  Vaping Use   Vaping status: Never Used  Substance and Sexual Activity   Alcohol use: Not Currently    Comment: quit drinking in 2016.   Drug use: No   Sexual activity: Yes  Other Topics Concern   Not  on file  Social History Narrative   Lives with his girlfriend   Social Determinants of Health   Financial Resource Strain: High Risk (09/09/2022)   Overall Financial Resource Strain (CARDIA)    Difficulty of Paying Living Expenses: Very hard  Food Insecurity: Food Insecurity Present (09/09/2022)   Hunger Vital Sign    Worried About Running Out of Food in the Last Year: Sometimes true    Ran Out of Food in the Last Year: Sometimes true  Transportation Needs: No Transportation Needs (09/09/2022)   PRAPARE - Administrator, Civil Service (Medical): No    Lack of Transportation (Non-Medical): No  Physical Activity: Inactive (09/09/2022)   Exercise Vital Sign    Days of Exercise per Week: 0 days    Minutes of Exercise per Session: 0 min  Stress: Stress Concern Present (09/09/2022)   [employer] of Occupational Health - Occupational Stress Questionnaire    Feeling of Stress : Rather much  Social Connections: Moderately Isolated (09/09/2022)   Social Connection and Isolation Panel [NHANES]    Frequency of Communication with Friends and Family: Once a week    Frequency of Social Gatherings with Friends and Family: Never    Attends Religious Services: 1 to 4 times per year    Active Member of [employer] or Organizations: No    Attends Banker Meetings: Never    Marital Status: Living with partner    Allergies:  Allergies  Allergen Reactions   Seasonal Ic [Octacosanol]     Current Medications: Current Outpatient Medications  Medication Sig Dispense Refill   albuterol (VENTOLIN HFA) 108 (90 Base) MCG/ACT inhaler Inhale 2 puffs into the lungs every 6 (six) hours as needed for wheezing. (Patient not taking: Reported on 11/02/2022) 2 each 11   meloxicam (MOBIC) 15 MG tablet Take 1 tablet (15 mg total) by mouth daily. (Patient not taking: Reported on 11/02/2022) 30 tablet 0   sertraline (ZOLOFT) 100 MG tablet Take 1.5 tablets (150 mg total) by mouth at bedtime. 45  tablet 2   tamsulosin (FLOMAX) 0.4 MG CAPS capsule Take 1 capsule (0.4 mg total) by mouth daily. (Patient not taking: Reported on 11/02/2022) 30 capsule 0   traZODone (DESYREL) 150 MG tablet Take 1 tablet (150 mg total) by mouth at bedtime as needed for sleep. 30 tablet 2   No current facility-administered medications for this visit.    ROS: Review of Systems - denies any physical complaints  Objective:  Psychiatric Specialty Exam: Weight 129 lb (58.5 kg).Body mass index is 19.61 kg/m.  General Appearance: Casual  Eye Contact:  Fair  Speech:  Clear and Coherent and Normal Rate  Volume:  Normal  Mood:   Euthymic  Affect:  Appropriate and Congruent  Thought Process:  Coherent, Goal Directed, and Linear  Orientation:  Full (Time, Place, and Person)  Thought Content: Logical   Suicidal Thoughts:  No  Homicidal Thoughts:  No  Memory:  Remote;   Good  Judgment:  Good  Insight:  Good  Psychomotor Activity:  Normal  Concentration:  Concentration: Fair and Attention Span: Fair              Assets:  Communication Skills Desire for Improvement Housing Leisure Time Physical Health Resilience Social Support Talents/Skills Transportation Vocational/Educational  ADL's:  Intact  Cognition: WNL  Sleep:   improving   PE: General: well-appearing; no acute distress  Pulm: no increased work of breathing on room air  Strength & Muscle Tone: within normal limits Neuro: no focal neurological deficits observed  Gait & Station: normal  Metabolic Disorder Labs: Lab Results  Component Value Date   HGBA1C 5.5 11/02/2022   No results found for: "PROLACTIN" Lab Results  Component Value Date   CHOL 217 (H) 08/27/2016   TRIG 117 08/27/2016   HDL 35 (L) 08/27/2016   CHOLHDL 6.2 (H) 08/27/2016   VLDL 23 08/27/2016   LDLCALC 159 (H) 08/27/2016   LDLCALC 120 08/22/2015   Lab Results  Component Value Date   TSH 0.645 11/02/2022   TSH 0.437 08/22/2015    Therapeutic Level  Labs: No results found for: "LITHIUM" No results found for: "VALPROATE" No results found for: "CBMZ"  Screenings: GAD-7    Flowsheet Row Counselor from 09/09/2022 in [facility] Office Visit from 11/17/2021 in [facility] Primary Care & Sports Medicine at [facility] Office Visit from 05/28/2020 in [facility] Primary Care & Sports Medicine at [facility] Office Visit from 08/22/2019 in [facility] Primary Care & Sports Medicine at [facility] Office Visit from 12/15/2018 in [facility] Primary Care & Sports Medicine at [facility]  Total GAD-7 Score 16 4 0 0 6      PHQ2-9    Flowsheet Row Counselor from 12/28/2022 in [facility] Office Visit from 11/02/2022 in [facility] Counselor from 09/09/2022 in [facility] Office Visit from 11/17/2021 in [facility] Primary Care & Sports Medicine at [facility] Office Visit from 12/23/2020 in [facility] Primary Care & Sports Medicine at [facility]  PHQ-2 Total Score 1 4 3 1  0  PHQ-9 Total Score 4 8 7 1  0      Flowsheet Row Counselor from 09/09/2022 in [facility] ED from 12/06/2021 in [facility] Emergency Department at [facility] ED from 11/08/2021 in [facility] Emergency Department at [facility]  C-SSRS RISK CATEGORY No Risk No Risk No Risk       Collaboration of Care: Collaboration of Care:  Patient/Guardian was advised Release of Information must be obtained prior to any record release in order to collaborate their care with an outside provider. Patient/Guardian was advised if they have not already done so to contact the registration department to sign all necessary forms in order for [place] to release information regarding their care.   Consent: Patient/Guardian gives verbal consent for treatment and assignment of benefits for services  provided during this visit. Patient/Guardian expressed understanding and agreed to proceed.   Park Pope, MD 01/27/2023, 6:07 PM

## 2023-01-27 ENCOUNTER — Ambulatory Visit (INDEPENDENT_AMBULATORY_CARE_PROVIDER_SITE_OTHER): Payer: No Payment, Other | Admitting: Student

## 2023-01-27 ENCOUNTER — Other Ambulatory Visit: Payer: Self-pay

## 2023-01-27 VITALS — Wt 129.0 lb

## 2023-01-27 DIAGNOSIS — F332 Major depressive disorder, recurrent severe without psychotic features: Secondary | ICD-10-CM

## 2023-01-27 DIAGNOSIS — F411 Generalized anxiety disorder: Secondary | ICD-10-CM

## 2023-01-27 MED ORDER — TRAZODONE HCL 150 MG PO TABS
150.0000 mg | ORAL_TABLET | Freq: Every evening | ORAL | 2 refills | Status: DC | PRN
  Filled 2023-01-27 – 2023-02-23 (×2): qty 30, 30d supply, fill #0
  Filled 2023-03-25 (×2): qty 30, 30d supply, fill #1
  Filled 2023-04-25: qty 30, 30d supply, fill #2

## 2023-01-27 MED ORDER — SERTRALINE HCL 100 MG PO TABS
150.0000 mg | ORAL_TABLET | Freq: Every day | ORAL | 2 refills | Status: DC
  Filled 2023-01-27 – 2023-02-23 (×2): qty 45, 30d supply, fill #0
  Filled 2023-03-25: qty 45, 30d supply, fill #1

## 2023-01-27 NOTE — Patient Instructions (Signed)
Call [phone] to reschedule your GI appointment.

## 2023-01-28 ENCOUNTER — Other Ambulatory Visit: Payer: Self-pay

## 2023-01-31 NOTE — Addendum Note (Signed)
Addended by: Theodoro Kos A on: 01/31/2023 02:06 PM   Modules accepted: Level of Service

## 2023-02-01 ENCOUNTER — Ambulatory Visit: Payer: Self-pay | Admitting: Nurse Practitioner

## 2023-02-22 ENCOUNTER — Ambulatory Visit (INDEPENDENT_AMBULATORY_CARE_PROVIDER_SITE_OTHER): Payer: No Payment, Other | Admitting: Clinical

## 2023-02-22 DIAGNOSIS — F332 Major depressive disorder, recurrent severe without psychotic features: Secondary | ICD-10-CM

## 2023-02-23 ENCOUNTER — Other Ambulatory Visit: Payer: Self-pay

## 2023-02-24 ENCOUNTER — Other Ambulatory Visit: Payer: Self-pay

## 2023-02-25 NOTE — Progress Notes (Unsigned)
THERAPIST PROGRESS NOTE Virtual Visit via Video Note  I connected with Adam Norton on 02/22/2023 at  3:00 PM EDT by a video enabled telemedicine application and verified that I am speaking with the correct person using two identifiers.  Location: Patient: work Provider: home   I discussed the limitations of evaluation and management by telemedicine and the availability of in person appointments. The patient expressed understanding and agreed to proceed.   Follow Up Instructions: I discussed the assessment and treatment plan with the patient. The patient was provided an opportunity to ask questions and all were answered. The patient agreed with the plan and demonstrated an understanding of the instructions.   The patient was advised to call back or seek an in-person evaluation if the symptoms worsen or if the condition fails to improve as anticipated.   Session Time: 25 minutes  Participation Level: Active  Behavioral Response: CasualAlertDepressed  Type of Therapy: Individual Therapy  Treatment Goals addressed: client will reduce frequency, intensity, and duration of depression symptoms so that daily functioning improved  ProgressTowards Goals: Progressing  Interventions: CBT and Supportive  Summary:  Adam Norton is a 47 y.o. male who presents for the scheduled appointment oriented x 5, appropriately dressed, and friendly. Client denied hallucinations and delusions. Client reported on today he has been trying to maintain the best he can.  Client reported over the past month he did go for a interview with [employer] but did not make it past the screening process.  Client reported he is still keeping his eye open for other employment opportunities that we will possibly pay him more.  Client reported unfortunately his truck he did get repossessed and he is now having to ask friends to help him get back and forth to work with other places that he may need to go.  Client reported things  at home with his girlfriend are going fairly okay.  Client reported however he he feels that everything he tries to do to catch up and improve upon their financial situation is still not good enough for her. Client reported he needed to end the session early today. Evidence of progress towards goal: client uses social support 7 days per week for emotional and other needs to help him.    Suicidal/Homicidal: Nowithout intent/plan  Therapist Response:  Therapist began the appointment asking the client how he has been doing. Therapist used CBT to engage using active listening and positive emotional support. Therapist used CBT to engage and ask the client about changes that have occurred. Therapist used CBT to normalize his emotions and positively reinforce steps he is taking to improve his stressors Therapist used CBT ask the client to identify his progress with frequency of use with coping skills with continued practice in his daily activity.    Therapist assigned the client homework to practice self care.   Plan: Return again in 3 weeks.  Diagnosis: severe recurrent major depression without psychotic feature  Collaboration of Care: Patient refused AEB none requested by the client.  Patient/Guardian was advised Release of Information must be obtained prior to any record release in order to collaborate their care with an outside provider. Patient/Guardian was advised if they have not already done so to contact the registration department to sign all necessary forms in order for [place] to release information regarding their care.   Consent: Patient/Guardian gives verbal consent for treatment and assignment of benefits for services provided during this visit. Patient/Guardian expressed understanding and agreed to proceed.  Neena Rhymes , LCSW 02/22/2023

## 2023-03-24 ENCOUNTER — Encounter (HOSPITAL_COMMUNITY): Payer: No Payment, Other | Admitting: Student

## 2023-03-25 ENCOUNTER — Other Ambulatory Visit: Payer: Self-pay

## 2023-04-25 ENCOUNTER — Other Ambulatory Visit: Payer: Self-pay

## 2023-04-26 ENCOUNTER — Other Ambulatory Visit: Payer: Self-pay

## 2023-04-27 IMAGING — MR MR HIP*L* W/O CM
4 of 5 series · 23 of 40 positions shown · non-contrast
Comparison: Radiographs 11/25/2020

CLINICAL DATA: Recently progressive chronic left hip and groin
pain. Stress fracture suspected.

EXAM:
MR OF THE LEFT HIP WITHOUT CONTRAST
TECHNIQUE: Multiplanar, multisequence MR imaging was performed. No intravenous
contrast was administered.

[Series 3: T1 · coronal · 4.0mm · 0.53mm/px · 3 of 36 slices shown]
[im 4/36]
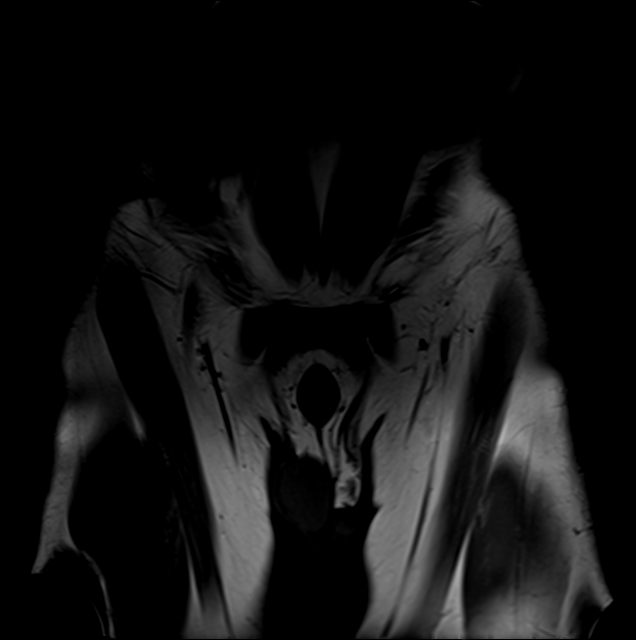
[im 20/36]
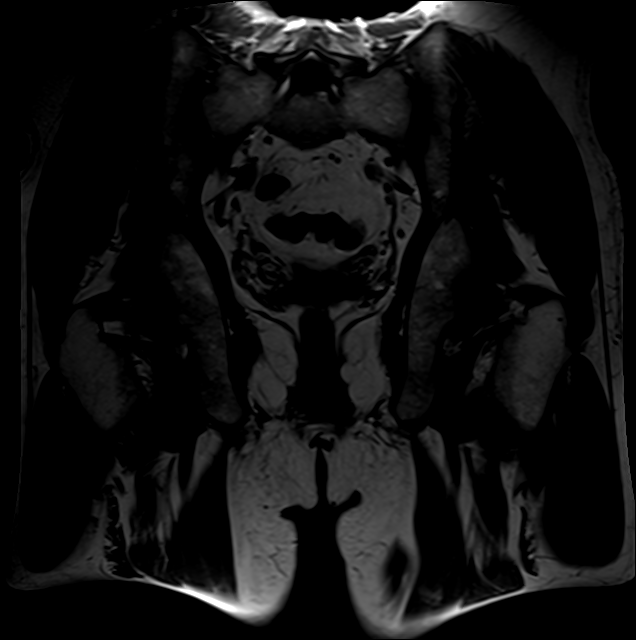
[im 32/36]
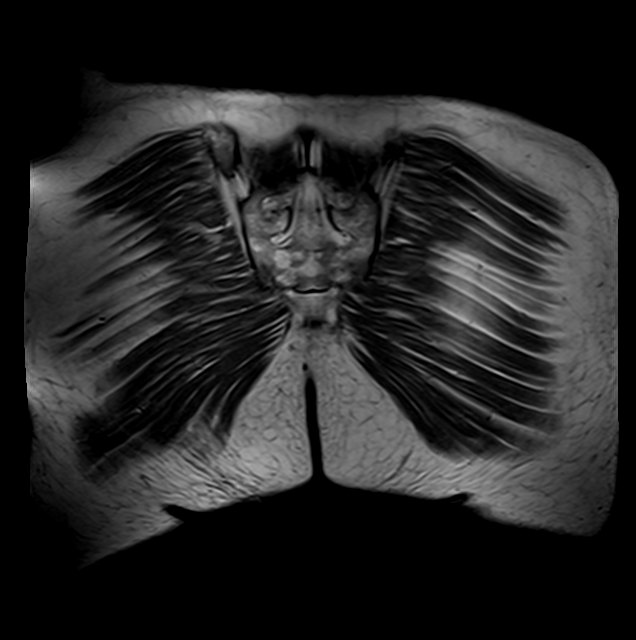

[Series 6: PD fat-sat · sagittal · 4.0mm · 0.35mm/px · 7 of 27 slices shown (1 of 2)]
[im 1/27]
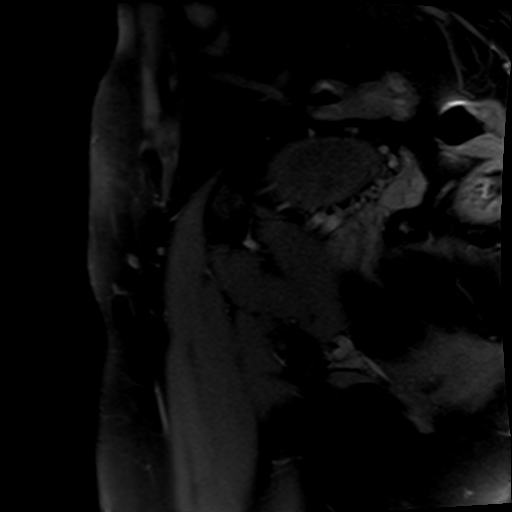
[im 5/27]
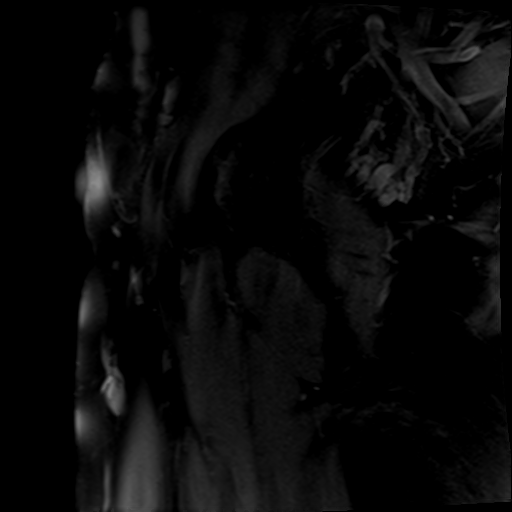
[im 9/27]
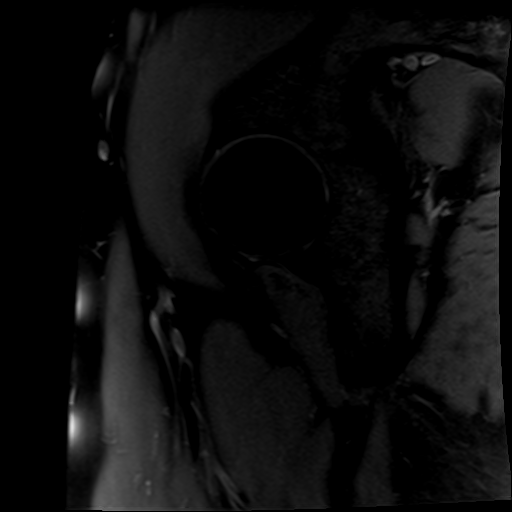
[im 14/27]
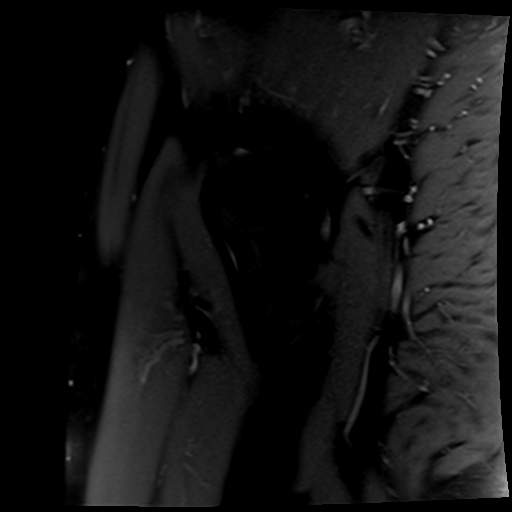
[im 18/27]
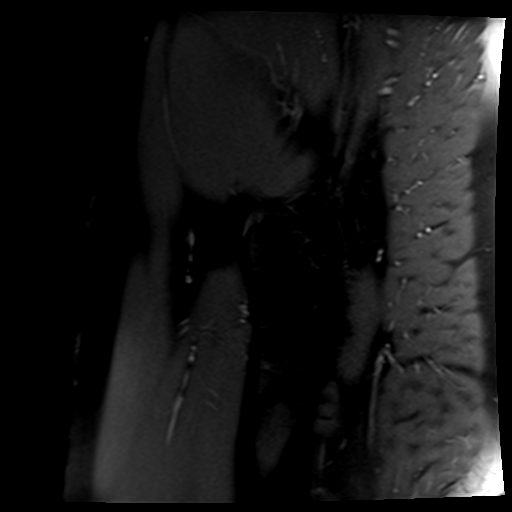
[im 22/27]
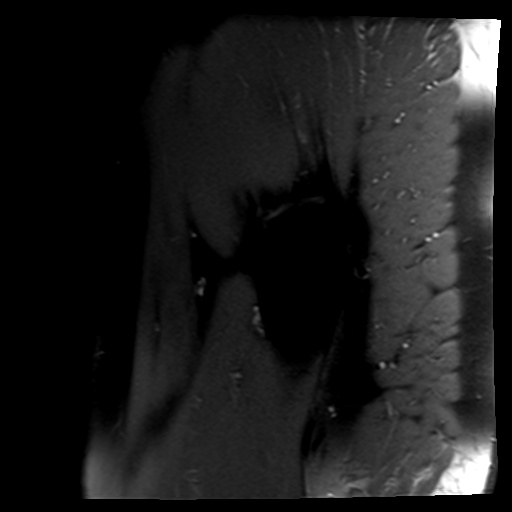
[im 27/27]
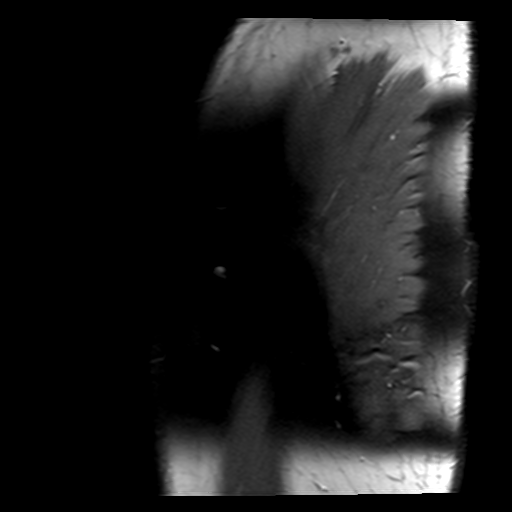

[Series 7: PD fat-sat · coronal · 4.0mm · 0.35mm/px · 6 of 24 slices shown (2 of 2)]
[im 1/24]
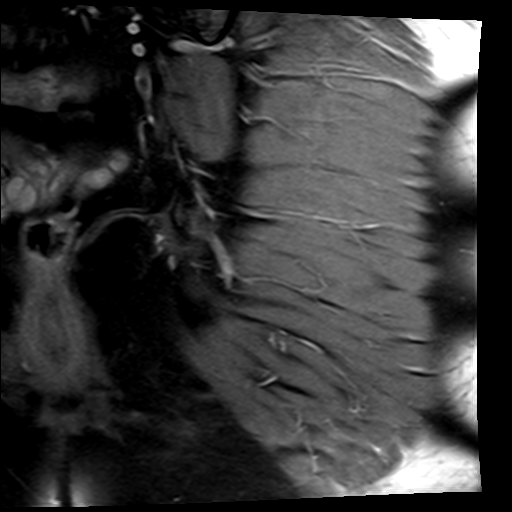
[im 5/24]
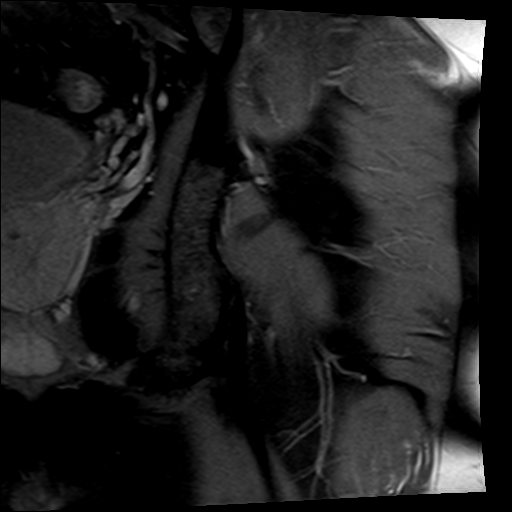
[im 10/24]
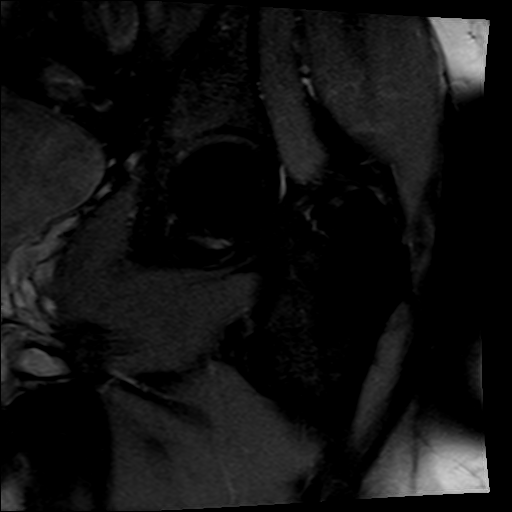
[im 14/24]
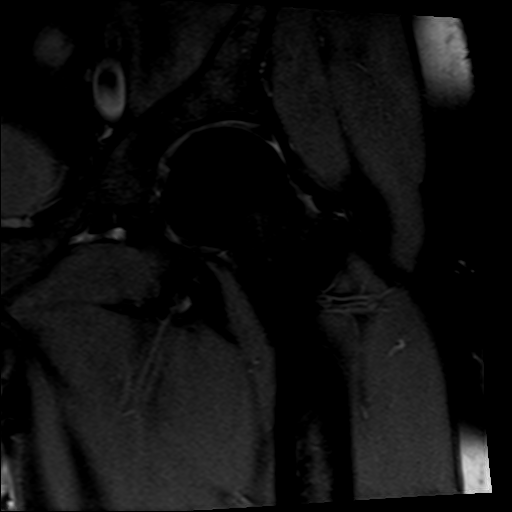
[im 19/24]
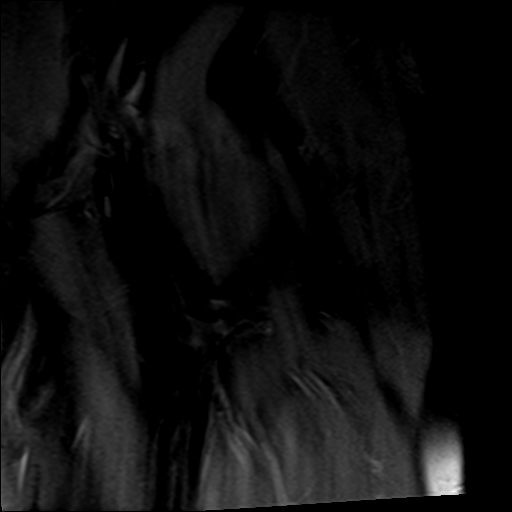
[im 24/24]
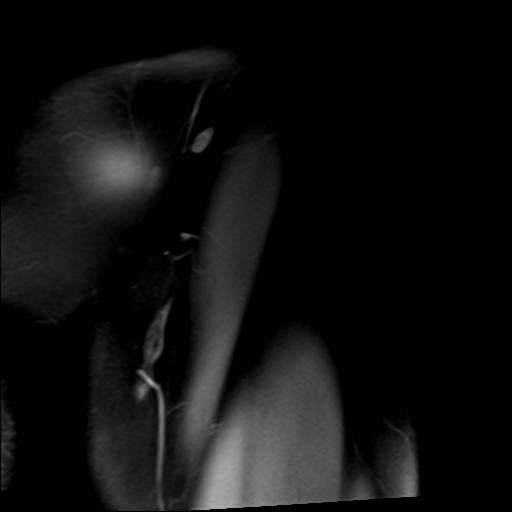

[Series 8: T2 fat-sat · axial · 4.0mm · 1.33mm/px · z∈[-49,+71]mm · 7 of 30 slices shown]
[im 1/30]
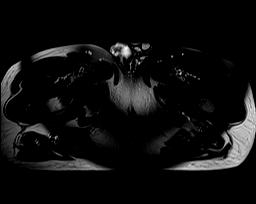
[im 5/30]
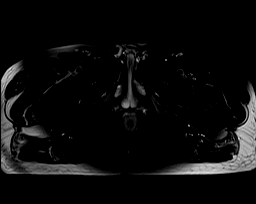
[im 9/30]
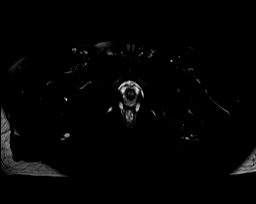
[im 13/30]
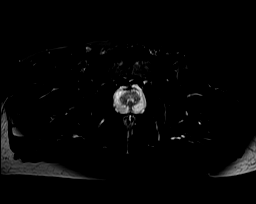
[im 17/30]
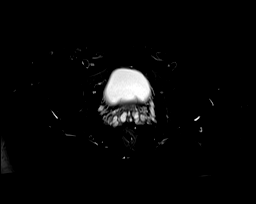
[im 21/30]
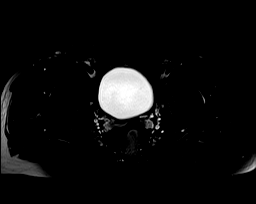
[im 25/30]
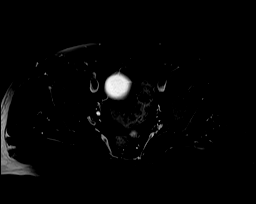

[23 of 40 positions shown; findings below may reference images not displayed]

FINDINGS: Bones: There is no evidence of acute fracture, dislocation or
avascular necrosis. There is no evidence of femoral stress fracture.
There is subchondral edema adjacent to the sacroiliac joints
bilaterally, suspicious for sacroiliitis. The visualized bony pelvis
otherwise appears unremarkable. Small sclerotic density overlying
the left pubic bone on prior radiographs is not clearly visualized
and may reflect a soft tissue calcification based on variable
orientation to the pubic bone on the radiographs.

Articular cartilage and labrum

Articular cartilage: No focal chondral defect or subchondral signal
abnormality identified.

Labrum: There is no gross labral tear or paralabral abnormality.

Joint or bursal effusion

Joint effusion: No significant hip joint effusion.

Bursae: No focal periarticular fluid collection.

Muscles and tendons

Muscles and tendons: The visualized gluteus, hamstring and iliopsoas
tendons appear normal. The piriformis muscles appear symmetric.

Other findings

Miscellaneous: The visualized internal pelvic contents appear
unremarkable.
IMPRESSION: 1. Subchondral edema adjacent to the sacroiliac joints bilaterally,
suspicious for sacroiliitis.
2. No other acute or significant osseous findings. No evidence of
femoral stress fracture.
3. The hip muscles and tendons appear unremarkable.

## 2023-05-26 ENCOUNTER — Other Ambulatory Visit (HOSPITAL_COMMUNITY): Payer: Self-pay

## 2023-05-26 ENCOUNTER — Other Ambulatory Visit: Payer: Self-pay

## 2023-05-26 ENCOUNTER — Telehealth (HOSPITAL_COMMUNITY): Payer: Self-pay | Admitting: *Deleted

## 2023-05-26 NOTE — Telephone Encounter (Signed)
Pt LVM requesting refill of the Trazodone 150 mg at bedtime PRN. Last script e-scibed on 01/27/23 for #30 with two fills. Writer attempted to advise pt that he will need to make, and keep, appointment at [facility] for bridge. Unfortunately pt's VM has not been set up.

## 2023-05-27 ENCOUNTER — Other Ambulatory Visit: Payer: Self-pay

## 2023-06-03 ENCOUNTER — Other Ambulatory Visit: Payer: Self-pay

## 2023-06-03 ENCOUNTER — Telehealth (HOSPITAL_COMMUNITY): Payer: Self-pay | Admitting: Student

## 2023-06-03 DIAGNOSIS — F332 Major depressive disorder, recurrent severe without psychotic features: Secondary | ICD-10-CM

## 2023-06-03 MED ORDER — TRAZODONE HCL 150 MG PO TABS
150.0000 mg | ORAL_TABLET | Freq: Every evening | ORAL | 2 refills | Status: DC | PRN
  Filled 2023-06-03: qty 30, 30d supply, fill #0
  Filled 2023-07-04 (×2): qty 30, 30d supply, fill #1

## 2023-06-03 MED ORDER — SERTRALINE HCL 100 MG PO TABS
150.0000 mg | ORAL_TABLET | Freq: Every day | ORAL | 2 refills | Status: DC
  Filled 2023-06-03: qty 45, 30d supply, fill #0
  Filled 2023-07-04: qty 45, 30d supply, fill #1

## 2023-06-03 NOTE — Telephone Encounter (Signed)
Refill of trazodone and sertraline sent to preferred pharmacy to cover patient until January 2024

## 2023-07-04 ENCOUNTER — Other Ambulatory Visit: Payer: Self-pay

## 2023-07-12 NOTE — Progress Notes (Signed)
 BH MD Outpatient Progress Note  07/21/2023 1:02 PM Adam Norton  MRN:  [id number]  Assessment:  Adam Norton presents for follow-up evaluation in-person. Today, 07/21/23, patient reports struggling with financial stressors which has led to feelings of anxiety but presently manageable. Sleep and mood have been relatively stable with trazodone  and sertraline  respectively. Tolerating medication regimen well and opts to continue medications as prescribed below. Plan to discuss more with regards to smoking cessation.   Identifying Information: Patient is a 47 year old male with past psychiatric history of MDD and medical history of kidney stones and umbilical hernia who is an established patient with [facility] for medication management.   Plan: Major depressive disorder-recurrent episode, severe without psychosis Past medication trials:  Status of problem: improving Interventions: -- Continue zoloft  150 mg and switch to nightly for depressive symptoms -- Continue trazodone  150 mg nightly as needed for sleep -- Continue psychotherapy -- Referral to case manager for medicaid and any other financial resources  Insomnia 2/2 caffeine use -advise decreased caffeine use and stop caffeine intake by noon/early afternoon  Generalized Anxiety Disorder -zoloft  and psychotherapy as above  Nicotine Use Disorder Status: precontemplative -Recommend cessation -CXR no acute cardiopulmonary disease  Patient was given contact information for behavioral health clinic and was instructed to call 911 for emergencies.   Subjective:  Chief Complaint:  No chief complaint on file.   Interval History:  Patient presents as follow-up.  He reports feeling stressed out due to losing his car, house being possibly foreclosed, and barely being able to make ends meet financially.  He denies SI/HI/AVH.  He reports that his mood has been relatively stable and while anxious, he does state that it  is currently manageable.  He reports benefit from current regiment of sertraline  and trazodone  which she continues to be compliant with.  He states he sleeps regularly and feels well rested when he wakes up.  Without trazodone , he states that he will have problems with sleep continuation in the middle of the night dating he would wake up multiple times in the night.  He does admit to using caffeine as late as 6 PM which we discussed may be contributing to his insomnia.  He reports that given his symptoms have generally been manageable that he would like to stay on the same doses of his psychotropic medications.  He denies alcohol or illicit substance use but does continue to smoke ~1 ppd of cigarettes. Patient is to follow-up in 2-3 months.  Visit Diagnosis:    ICD-10-CM   1. Severe recurrent major depression without psychotic features (HCC)  F33.2 sertraline  (ZOLOFT ) 100 MG tablet    traZODone  (DESYREL ) 150 MG tablet    2. Caffeine use  Z78.9    overuse      Past Psychiatric Hx:  Previous Psych Diagnoses: MDD, history of cannabis abuse, nicotine dependence Prior inpatient treatment: Denies Current meds: None Psychotherapy hx: Got psychotherapy for 8 months in 2016 Previous suicidal attempts: Denies Previous medication trials: Zoloft  100 mg daily and trazodone  100 mg nightly Current therapist: Started therapy with [name]  Past Medical History:  Past Medical History:  Diagnosis Date   Alcohol abuse    Depression    Kidney stone    Kidney stone    Tobacco abuse     Past Surgical History:  Procedure Laterality Date   KIDNEY STONE SURGERY      Family History:  Family History  Problem Relation Age of Onset   Alcohol abuse  Father    Diabetes Father    Cancer Father        pancreatic cancer   Alcohol abuse Brother    Colon cancer Neg Hx    Prostate cancer Neg Hx     Social History:  Social History   Socioeconomic History   Marital status: Single    Spouse name: Not  on file   Number of children: Not on file   Years of education: Not on file   Highest education level: Not on file  Occupational History   Not on file  Tobacco Use   Smoking status: Every Day    Current packs/day: 1.00    Types: Cigarettes   Smokeless tobacco: Never  Vaping Use   Vaping status: Never Used  Substance and Sexual Activity   Alcohol use: Not Currently    Comment: quit drinking in 2016.   Drug use: No   Sexual activity: Yes  Other Topics Concern   Not on file  Social History Narrative   Lives with his girlfriend   Social Drivers of Health   Financial Resource Strain: High Risk (09/09/2022)   Overall Financial Resource Strain (CARDIA)    Difficulty of Paying Living Expenses: Very hard  Food Insecurity: Food Insecurity Present (09/09/2022)   Hunger Vital Sign    Worried About Running Out of Food in the Last Year: Sometimes true    Ran Out of Food in the Last Year: Sometimes true  Transportation Needs: No Transportation Needs (09/09/2022)   PRAPARE - Administrator, Civil Service (Medical): No    Lack of Transportation (Non-Medical): No  Physical Activity: Inactive (09/09/2022)   Exercise Vital Sign    Days of Exercise per Week: 0 days    Minutes of Exercise per Session: 0 min  Stress: Stress Concern Present (09/09/2022)   [employer] of Occupational Health - Occupational Stress Questionnaire    Feeling of Stress : Rather much  Social Connections: Moderately Isolated (09/09/2022)   Social Connection and Isolation Panel [NHANES]    Frequency of Communication with Friends and Family: Once a week    Frequency of Social Gatherings with Friends and Family: Never    Attends Religious Services: 1 to 4 times per year    Active Member of [employer] or Organizations: No    Attends Banker Meetings: Never    Marital Status: Living with partner    Allergies:  Allergies  Allergen Reactions   Seasonal Ic [Octacosanol]     Current  Medications: Current Outpatient Medications  Medication Sig Dispense Refill   albuterol  (VENTOLIN  HFA) 108 (90 Base) MCG/ACT inhaler Inhale 2 puffs into the lungs every 6 (six) hours as needed for wheezing. (Patient not taking: Reported on 11/02/2022) 2 each 11   meloxicam  (MOBIC ) 15 MG tablet Take 1 tablet (15 mg total) by mouth daily. (Patient not taking: Reported on 11/02/2022) 30 tablet 0   sertraline  (ZOLOFT ) 100 MG tablet Take 1.5 tablets (150 mg total) by mouth at bedtime. 135 tablet 0   tamsulosin  (FLOMAX ) 0.4 MG CAPS capsule Take 1 capsule (0.4 mg total) by mouth daily. (Patient not taking: Reported on 11/02/2022) 30 capsule 0   traZODone  (DESYREL ) 150 MG tablet Take 1 tablet (150 mg total) by mouth at bedtime as needed for sleep. 90 tablet 0   No current facility-administered medications for this visit.    ROS: Review of Systems - denies any physical complaints  Objective:  Psychiatric Specialty Exam: There were no  vitals taken for this visit.There is no height or weight on file to calculate BMI.  General Appearance: Casual  Eye Contact:  Fair  Speech:  Clear and Coherent and Normal Rate  Volume:  Normal  Mood:   Overwhelmed  Affect:  Appropriate and Congruent  Thought Process:  Coherent, Goal Directed, and Linear  Orientation:  Full (Time, Place, and Person)  Thought Content: Logical   Suicidal Thoughts:  No  Homicidal Thoughts:  No  Memory:  Remote;   Good  Judgment:  Good  Insight:  Good  Psychomotor Activity:  Normal  Concentration:  Concentration: Fair and Attention Span: Fair              Assets:  Communication Skills Desire for Improvement Housing Leisure Time Physical Health Resilience Social Support Talents/Skills Transportation Vocational/Educational  ADL's:  Intact  Cognition: WNL  Sleep:   improving   PE: General: well-appearing; no acute distress  Pulm: no increased work of breathing on room air  Strength & Muscle Tone: within normal  limits Neuro: no focal neurological deficits observed  Gait & Station: normal  Metabolic Disorder Labs: Lab Results  Component Value Date   HGBA1C 5.5 11/02/2022   No results found for: PROLACTIN Lab Results  Component Value Date   CHOL 217 (H) 08/27/2016   TRIG 117 08/27/2016   HDL 35 (L) 08/27/2016   CHOLHDL 6.2 (H) 08/27/2016   VLDL 23 08/27/2016   LDLCALC 159 (H) 08/27/2016   LDLCALC 120 08/22/2015   Lab Results  Component Value Date   TSH 0.645 11/02/2022   TSH 0.437 08/22/2015    Therapeutic Level Labs: No results found for: LITHIUM No results found for: VALPROATE No results found for: CBMZ  Screenings: GAD-7    Flowsheet Row Counselor from 09/09/2022 in [facility] Office Visit from 11/17/2021 in [facility] Primary Care & Sports Medicine at [facility] Office Visit from 05/28/2020 in [facility] Primary Care & Sports Medicine at [facility] Office Visit from 08/22/2019 in [facility] Primary Care & Sports Medicine at [facility] Office Visit from 12/15/2018 in [facility] Primary Care & Sports Medicine at [facility]  Total GAD-7 Score 16 4 0 0 6      PHQ2-9    Flowsheet Row Counselor from 12/28/2022 in [facility] Office Visit from 11/02/2022 in [facility] Counselor from 09/09/2022 in [facility] Office Visit from 11/17/2021 in [facility] Primary Care & Sports Medicine at [facility] Office Visit from 12/23/2020 in [facility] Primary Care & Sports Medicine at [facility]  PHQ-2 Total Score 1 4 3 1  0  PHQ-9 Total Score 4 8 7 1  0      Flowsheet Row Counselor from 09/09/2022 in [facility] ED from 12/06/2021 in [facility] Emergency Department at [facility] ED from 11/08/2021 in [facility] Emergency Department at [facility]  C-SSRS RISK CATEGORY No Risk No Risk No Risk       Collaboration of Care: Collaboration of Care:  Patient/Guardian was advised Release of Information must be obtained prior to any record release in order to collaborate their care with an outside provider. Patient/Guardian was advised if they have not already done so to contact the registration department to sign all necessary forms in order for us  to release information regarding their care.   Consent: Patient/Guardian  gives verbal consent for treatment and assignment of benefits for services provided during this visit. Patient/Guardian expressed understanding and agreed to proceed.   Prentice Espy, MD 07/21/2023, 1:02 PM

## 2023-07-21 ENCOUNTER — Ambulatory Visit (HOSPITAL_COMMUNITY): Payer: No Payment, Other | Admitting: Student

## 2023-07-21 ENCOUNTER — Other Ambulatory Visit: Payer: Self-pay

## 2023-07-21 ENCOUNTER — Encounter (HOSPITAL_COMMUNITY): Payer: Self-pay | Admitting: Student

## 2023-07-21 DIAGNOSIS — F17210 Nicotine dependence, cigarettes, uncomplicated: Secondary | ICD-10-CM

## 2023-07-21 DIAGNOSIS — F15982 Other stimulant use, unspecified with stimulant-induced sleep disorder: Secondary | ICD-10-CM | POA: Diagnosis not present

## 2023-07-21 DIAGNOSIS — Z789 Other specified health status: Secondary | ICD-10-CM | POA: Diagnosis not present

## 2023-07-21 DIAGNOSIS — F411 Generalized anxiety disorder: Secondary | ICD-10-CM

## 2023-07-21 DIAGNOSIS — F332 Major depressive disorder, recurrent severe without psychotic features: Secondary | ICD-10-CM | POA: Diagnosis not present

## 2023-07-21 MED ORDER — SERTRALINE HCL 100 MG PO TABS
150.0000 mg | ORAL_TABLET | Freq: Every day | ORAL | 0 refills | Status: DC
  Filled 2023-07-21 – 2023-09-22 (×2): qty 135, 90d supply, fill #0

## 2023-07-21 MED ORDER — TRAZODONE HCL 150 MG PO TABS
150.0000 mg | ORAL_TABLET | Freq: Every evening | ORAL | 0 refills | Status: DC | PRN
  Filled 2023-07-21 – 2023-08-01 (×2): qty 90, 90d supply, fill #0

## 2023-07-21 NOTE — Addendum Note (Signed)
 Addended by: Tia Masker on: 07/21/2023 01:50 PM   Modules accepted: Orders

## 2023-08-01 ENCOUNTER — Other Ambulatory Visit: Payer: Self-pay

## 2023-08-02 ENCOUNTER — Other Ambulatory Visit: Payer: Self-pay

## 2023-09-22 ENCOUNTER — Other Ambulatory Visit: Payer: Self-pay

## 2023-09-23 ENCOUNTER — Other Ambulatory Visit: Payer: Self-pay

## 2023-10-12 NOTE — Progress Notes (Unsigned)
 BH MD Outpatient Progress Note  10/12/2023 3:45 PM Adam Norton  MRN:  [id number]  Assessment:  Dorthula Rue presents for follow-up evaluation in-person. Today, 10/12/23, patient reports struggling with financial stressors which has led to feelings of anxiety but presently manageable. Sleep and mood have been relatively stable with trazodone and sertraline respectively. Tolerating medication regimen well and opts to continue medications as prescribed below. Plan to discuss more with regards to smoking cessation.   Identifying Information: Patient is a 48 year old male with past psychiatric history of MDD and medical history of kidney stones and umbilical hernia who is an established patient with [facility] for medication management.   Plan: Major depressive disorder-recurrent episode, severe without psychosis Past medication trials:  Status of problem: improving Interventions: -- Continue zoloft 150 mg and switch to nightly for depressive symptoms -- Continue trazodone 150 mg nightly as needed for sleep -- Continue psychotherapy -- Referral to case manager for medicaid and any other financial resources  Insomnia 2/2 caffeine use -advise decreased caffeine use and stop caffeine intake by noon/early afternoon  Generalized Anxiety Disorder -zoloft and psychotherapy as above  Nicotine Use Disorder Status: precontemplative -Recommend cessation -CXR no acute cardiopulmonary disease  Patient was given contact information for behavioral health clinic and was instructed to call 911 for emergencies.   Subjective:  Chief Complaint:  No chief complaint on file.   Interval History:  Patient presents as follow-up.  He reports feeling stressed out due to losing his car, house being possibly foreclosed, and barely being able to make ends meet financially.  He denies SI/HI/AVH.  He reports that his mood has been relatively stable and while anxious, he does state that it  is currently manageable.  He reports benefit from current regiment of sertraline and trazodone which she continues to be compliant with.  He states he sleeps regularly and feels well rested when he wakes up.  Without trazodone, he states that he will have problems with sleep continuation in the middle of the night dating he would wake up multiple times in the night.  He does admit to using caffeine as late as 6 PM which we discussed may be contributing to his insomnia.  He reports that given his symptoms have generally been manageable that he would like to stay on the same doses of his psychotropic medications.  He denies alcohol or illicit substance use but does continue to smoke ~1 ppd of cigarettes. Patient is to follow-up in 2-3 months.  Visit Diagnosis:  No diagnosis found.   Past Psychiatric Hx:  Previous Psych Diagnoses: MDD, history of cannabis abuse, nicotine dependence Prior inpatient treatment: Denies Current meds: None Psychotherapy hx: Got psychotherapy for 8 months in 2016 Previous suicidal attempts: Denies Previous medication trials: Zoloft 100 mg daily and trazodone 100 mg nightly Current therapist: Started therapy with Armed forces technical officer  Past Medical History:  Past Medical History:  Diagnosis Date   Alcohol abuse    Depression    Kidney stone    Kidney stone    Tobacco abuse     Past Surgical History:  Procedure Laterality Date   KIDNEY STONE SURGERY      Family History:  Family History  Problem Relation Age of Onset   Alcohol abuse Father    Diabetes Father    Cancer Father        pancreatic cancer   Alcohol abuse Brother    Colon cancer Neg Hx    Prostate cancer Neg Hx  Social History:  Social History   Socioeconomic History   Marital status: Single    Spouse name: Not on file   Number of children: Not on file   Years of education: Not on file   Highest education level: Not on file  Occupational History   Not on file  Tobacco Use   Smoking  status: Every Day    Current packs/day: 1.00    Types: Cigarettes   Smokeless tobacco: Never  Vaping Use   Vaping status: Never Used  Substance and Sexual Activity   Alcohol use: Not Currently    Comment: quit drinking in 2016.   Drug use: No   Sexual activity: Yes  Other Topics Concern   Not on file  Social History Narrative   Lives with his girlfriend   Social Drivers of Health   Financial Resource Strain: High Risk (09/09/2022)   Overall Financial Resource Strain (CARDIA)    Difficulty of Paying Living Expenses: Very hard  Food Insecurity: Food Insecurity Present (09/09/2022)   Hunger Vital Sign    Worried About Running Out of Food in the Last Year: Sometimes true    Ran Out of Food in the Last Year: Sometimes true  Transportation Needs: No Transportation Needs (09/09/2022)   PRAPARE - Administrator, Civil Service (Medical): No    Lack of Transportation (Non-Medical): No  Physical Activity: Inactive (09/09/2022)   Exercise Vital Sign    Days of Exercise per Week: 0 days    Minutes of Exercise per Session: 0 min  Stress: Stress Concern Present (09/09/2022)   [employer] of Occupational Health - Occupational Stress Questionnaire    Feeling of Stress : Rather much  Social Connections: Moderately Isolated (09/09/2022)   Social Connection and Isolation Panel [NHANES]    Frequency of Communication with Friends and Family: Once a week    Frequency of Social Gatherings with Friends and Family: Never    Attends Religious Services: 1 to 4 times per year    Active Member of [employer] or Organizations: No    Attends Banker Meetings: Never    Marital Status: Living with partner    Allergies:  Allergies  Allergen Reactions   Seasonal Ic [Octacosanol]     Current Medications: Current Outpatient Medications  Medication Sig Dispense Refill   albuterol (VENTOLIN HFA) 108 (90 Base) MCG/ACT inhaler Inhale 2 puffs into the lungs every 6 (six) hours as needed  for wheezing. (Patient not taking: Reported on 11/02/2022) 2 each 11   sertraline (ZOLOFT) 100 MG tablet Take 1.5 tablets (150 mg total) by mouth at bedtime. 135 tablet 0   traZODone (DESYREL) 150 MG tablet Take 1 tablet (150 mg total) by mouth at bedtime as needed for sleep. 90 tablet 0   No current facility-administered medications for this visit.    ROS: Review of Systems - denies any physical complaints  Objective:  Psychiatric Specialty Exam: There were no vitals taken for this visit.There is no height or weight on file to calculate BMI.  General Appearance: Casual  Eye Contact:  Fair  Speech:  Clear and Coherent and Normal Rate  Volume:  Normal  Mood:   Overwhelmed  Affect:  Appropriate and Congruent  Thought Process:  Coherent, Goal Directed, and Linear  Orientation:  Full (Time, Place, and Person)  Thought Content: Logical   Suicidal Thoughts:  No  Homicidal Thoughts:  No  Memory:  Remote;   Good  Judgment:  Good  Insight:  Good  Psychomotor Activity:  Normal  Concentration:  Concentration: Fair and Attention Span: Fair              Assets:  Communication Skills Desire for Improvement Housing Leisure Time Physical Health Resilience Social Support Talents/Skills Transportation Vocational/Educational  ADL's:  Intact  Cognition: WNL  Sleep:   improving   PE: General: well-appearing; no acute distress  Pulm: no increased work of breathing on room air  Strength & Muscle Tone: within normal limits Neuro: no focal neurological deficits observed  Gait & Station: normal  Metabolic Disorder Labs: Lab Results  Component Value Date   HGBA1C 5.5 11/02/2022   No results found for: "PROLACTIN" Lab Results  Component Value Date   CHOL 217 (H) 08/27/2016   TRIG 117 08/27/2016   HDL 35 (L) 08/27/2016   CHOLHDL 6.2 (H) 08/27/2016   VLDL 23 08/27/2016   LDLCALC 159 (H) 08/27/2016   LDLCALC 120 08/22/2015   Lab Results  Component Value Date   TSH 0.645  11/02/2022   TSH 0.437 08/22/2015    Therapeutic Level Labs: No results found for: "LITHIUM" No results found for: "VALPROATE" No results found for: "CBMZ"  Screenings: GAD-7    Flowsheet Row Counselor from 09/09/2022 in [facility] Office Visit from 11/17/2021 in [facility] Primary Care & Sports Medicine at [facility] Office Visit from 05/28/2020 in [facility] Primary Care & Sports Medicine at [facility] Office Visit from 08/22/2019 in [facility] Primary Care & Sports Medicine at [facility] Office Visit from 12/15/2018 in [facility] Primary Care & Sports Medicine at [facility]  Total GAD-7 Score 16 4 0 0 6      PHQ2-9    Flowsheet Row Counselor from 12/28/2022 in [facility] Office Visit from 11/02/2022 in [facility] Counselor from 09/09/2022 in [facility] Office Visit from 11/17/2021 in [facility] Primary Care & Sports Medicine at [facility] Office Visit from 12/23/2020 in [facility] Primary Care & Sports Medicine at [facility]  PHQ-2 Total Score 1 4 3 1  0  PHQ-9 Total Score 4 8 7 1  0      Flowsheet Row Counselor from 09/09/2022 in [facility] ED from 12/06/2021 in [facility] Emergency Department at [facility] ED from 11/08/2021 in [facility] Emergency Department at [facility]  C-SSRS RISK CATEGORY No Risk No Risk No Risk       Collaboration of Care: Collaboration of Care:  Patient/Guardian was advised Release of Information must be obtained prior to any record release in order to collaborate their care with an outside provider. Patient/Guardian was advised if they have not already done so to contact the registration department to sign all necessary forms in order for [place] to release information regarding their care.   Consent:  Patient/Guardian gives verbal consent for treatment and assignment of benefits for services provided during this visit. Patient/Guardian expressed understanding and agreed to proceed.   Park Pope, MD 10/12/2023, 3:45 PM

## 2023-10-13 ENCOUNTER — Encounter (HOSPITAL_COMMUNITY): Payer: Self-pay

## 2023-10-13 ENCOUNTER — Encounter (HOSPITAL_COMMUNITY): Payer: No Payment, Other | Admitting: Student

## 2023-10-14 NOTE — Progress Notes (Signed)
 No show

## 2024-01-27 ENCOUNTER — Encounter (HOSPITAL_COMMUNITY): Payer: Self-pay

## 2024-01-27 ENCOUNTER — Ambulatory Visit (INDEPENDENT_AMBULATORY_CARE_PROVIDER_SITE_OTHER): Admitting: Clinical

## 2024-01-27 ENCOUNTER — Telehealth (HOSPITAL_COMMUNITY): Payer: Self-pay | Admitting: Student

## 2024-01-27 DIAGNOSIS — F332 Major depressive disorder, recurrent severe without psychotic features: Secondary | ICD-10-CM

## 2024-01-27 DIAGNOSIS — F331 Major depressive disorder, recurrent, moderate: Secondary | ICD-10-CM
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
## 2024-01-27 NOTE — Telephone Encounter (Signed)
 This patient presented as a walk in today, stated he need med refill also wanted Therapy. I went ahead & scheduled him with you for next Friday & put him in as a walk in with his therapist for today, with hopes that you could send a bridge of meds to cover him from today until next Friday. Please advise. Thanks!

## 2024-01-29 MED ORDER — TRAZODONE HCL 150 MG PO TABS
150.0000 mg | ORAL_TABLET | Freq: Every evening | ORAL | 0 refills | Status: DC | PRN
  Filled 2024-01-29: qty 30, 30d supply, fill #0

## 2024-01-29 MED ORDER — SERTRALINE HCL 100 MG PO TABS
150.0000 mg | ORAL_TABLET | Freq: Every day | ORAL | 0 refills | Status: DC
  Filled 2024-01-29: qty 45, 30d supply, fill #0

## 2024-01-30 ENCOUNTER — Other Ambulatory Visit: Payer: Self-pay

## 2024-02-03 ENCOUNTER — Other Ambulatory Visit: Payer: Self-pay

## 2024-02-03 ENCOUNTER — Ambulatory Visit (INDEPENDENT_AMBULATORY_CARE_PROVIDER_SITE_OTHER): Admitting: Student

## 2024-02-03 DIAGNOSIS — F332 Major depressive disorder, recurrent severe without psychotic features: Secondary | ICD-10-CM | POA: Diagnosis not present

## 2024-02-03 MED ORDER — SERTRALINE HCL 100 MG PO TABS
ORAL_TABLET | ORAL | 0 refills | Status: DC
  Filled 2024-02-03: qty 132, 91d supply, fill #0

## 2024-02-03 MED ORDER — TRAZODONE HCL 150 MG PO TABS
75.0000 mg | ORAL_TABLET | Freq: Every evening | ORAL | 0 refills | Status: DC | PRN
  Filled 2024-02-03: qty 45, 90d supply, fill #0

## 2024-02-03 NOTE — Progress Notes (Signed)
 BH MD Outpatient Progress Note  02/03/2024 8:58 AM Adam Norton  MRN:  [id number]  Assessment:  Adam Norton presents for follow-up evaluation in-person. Today, 02/03/24, patient reports struggling with stressors of losing job and recent irritability secondary to running out of medication. Sleep has been stable with 75 mg of trazodone .  Advised relatively rapid uptitration of sertraline  to 150 mg and patient will follow-up in 1 month to evaluate for progress.  Identifying Information: Patient is a 48 year old male with past psychiatric history of MDD and medical history of kidney stones and umbilical hernia who is an established patient with [facility] for medication management.   Plan: Major depressive disorder-recurrent episode, moderate without psychosis Past medication trials:  Status of problem: improving Interventions: -- Restart zoloft  50 mg for 3 days then increase to 100 mg for 3 days then increase to 150 mg nightly for depressive symptoms -- Decrease trazodone  75 mg nightly as needed for sleep -- Continue psychotherapy -- Referral to case manager for medicaid and any other financial resources  Insomnia 2/2 caffeine use -advise decreased caffeine use and stop caffeine intake by noon/early afternoon  Generalized Anxiety Disorder -zoloft  and psychotherapy as above  Nicotine Use Disorder Status: precontemplative -Recommend cessation -CXR no acute cardiopulmonary disease  Patient was given contact information for behavioral health clinic and was instructed to call 911 for emergencies.   Subjective:  Chief Complaint:  Chief Complaint  Patient presents with   Medication Management    Interval History:  Patient presents as follow-up.  He reports ongoing stress after losing job that he had been hired for 3 days after starting due to cussing. He reports also noticing significant irritability with girlfriend as well as likely due to medication  nonadherence.  He reports running out approximately 1 month ago but had only requested approximately 1 week ago for a medication refill due to that. He reports sleeping fairly well with half dose of trazodone  as he reports with the full dose back to him falling into deep sleep such that girlfriend was not able to wake him.  He denies SI/HI/AVH.  He was amenable to restarting sertraline .   Visit Diagnosis:    ICD-10-CM   1. Severe recurrent major depression without psychotic features (HCC)  F33.2 sertraline  (ZOLOFT ) 100 MG tablet    traZODone  (DESYREL ) 150 MG tablet       Past Psychiatric Hx:  Previous Psych Diagnoses: MDD, history of cannabis abuse, nicotine dependence Prior inpatient treatment: Denies Current meds: None Psychotherapy hx: Got psychotherapy for 8 months in 2016 Previous suicidal attempts: Denies Previous medication trials: Zoloft  100 mg daily and trazodone  100 mg nightly Current therapist: Started therapy with [name]  Past Medical History:  Past Medical History:  Diagnosis Date   Alcohol abuse    Depression    Kidney stone    Kidney stone    Tobacco abuse     Past Surgical History:  Procedure Laterality Date   KIDNEY STONE SURGERY      Family History:  Family History  Problem Relation Age of Onset   Alcohol abuse Father    Diabetes Father    Cancer Father        pancreatic cancer   Alcohol abuse Brother    Colon cancer Neg Hx    Prostate cancer Neg Hx     Social History:  Social History   Socioeconomic History   Marital status: Single    Spouse name: Not on file   Number of  children: Not on file   Years of education: Not on file   Highest education level: Not on file  Occupational History   Not on file  Tobacco Use   Smoking status: Every Day    Current packs/day: 1.00    Types: Cigarettes   Smokeless tobacco: Never  Vaping Use   Vaping status: Never Used  Substance and Sexual Activity   Alcohol use: Not Currently    Comment: quit  drinking in 2016.   Drug use: No   Sexual activity: Yes  Other Topics Concern   Not on file  Social History Narrative   Lives with his girlfriend   Social Drivers of Health   Financial Resource Strain: High Risk (09/09/2022)   Overall Financial Resource Strain (CARDIA)    Difficulty of Paying Living Expenses: Very hard  Food Insecurity: Food Insecurity Present (09/09/2022)   Hunger Vital Sign    Worried About Running Out of Food in the Last Year: Sometimes true    Ran Out of Food in the Last Year: Sometimes true  Transportation Needs: No Transportation Needs (09/09/2022)   PRAPARE - Administrator, Civil Service (Medical): No    Lack of Transportation (Non-Medical): No  Physical Activity: Inactive (09/09/2022)   Exercise Vital Sign    Days of Exercise per Week: 0 days    Minutes of Exercise per Session: 0 min  Stress: Stress Concern Present (09/09/2022)   [employer] of Occupational Health - Occupational Stress Questionnaire    Feeling of Stress : Rather much  Social Connections: Moderately Isolated (09/09/2022)   Social Connection and Isolation Panel    Frequency of Communication with Friends and Family: Once a week    Frequency of Social Gatherings with Friends and Family: Never    Attends Religious Services: 1 to 4 times per year    Active Member of [employer] or Organizations: No    Attends Banker Meetings: Never    Marital Status: Living with partner    Allergies:  Allergies  Allergen Reactions   Seasonal Ic [Octacosanol]     Current Medications: Current Outpatient Medications  Medication Sig Dispense Refill   albuterol  (VENTOLIN  HFA) 108 (90 Base) MCG/ACT inhaler Inhale 2 puffs into the lungs every 6 (six) hours as needed for wheezing. (Patient not taking: Reported on 11/02/2022) 2 each 11   sertraline  (ZOLOFT ) 100 MG tablet Take 0.5 tablets (50 mg total) by mouth at bedtime for 3 days, THEN 1 tablet (100 mg total) at bedtime for 3 days, THEN  1.5 tablets (150 mg total) at bedtime. 132 tablet 0   traZODone  (DESYREL ) 150 MG tablet Take 0.5 tablets (75 mg total) by mouth at bedtime as needed for sleep. 45 tablet 0   No current facility-administered medications for this visit.    ROS: Review of Systems - denies any physical complaints  Objective:  Psychiatric Specialty Exam: Weight 132 lb (59.9 kg).Body mass index is 20.07 kg/m.  General Appearance: Casual  Eye Contact:  Fair  Speech:  Clear and Coherent and Normal Rate  Volume:  Normal  Mood:  irritable  Affect:  constricted  Thought Process:  Coherent, Goal Directed, and Linear  Orientation:  Full (Time, Place, and Person)  Thought Content: Logical   Suicidal Thoughts:  No  Homicidal Thoughts:  No  Memory:  Remote;   Good  Judgment:  Good  Insight:  Good  Psychomotor Activity:  Normal  Concentration:  Concentration: Fair and Attention Span: Fair  Assets:  Communication Skills Desire for Improvement Housing Leisure Time Physical Health Resilience Social Support Talents/Skills Transportation Vocational/Educational  ADL's:  Intact  Cognition: WNL  Sleep:  improving   PE: General: well-appearing; no acute distress  Pulm: no increased work of breathing on room air  Strength & Muscle Tone: within normal limits Neuro: no focal neurological deficits observed  Gait & Station: normal  Metabolic Disorder Labs: Lab Results  Component Value Date   HGBA1C 5.5 11/02/2022   No results found for: PROLACTIN Lab Results  Component Value Date   CHOL 217 (H) 08/27/2016   TRIG 117 08/27/2016   HDL 35 (L) 08/27/2016   CHOLHDL 6.2 (H) 08/27/2016   VLDL 23 08/27/2016   LDLCALC 159 (H) 08/27/2016   LDLCALC 120 08/22/2015   Lab Results  Component Value Date   TSH 0.645 11/02/2022   TSH 0.437 08/22/2015    Therapeutic Level Labs: No results found for: LITHIUM No results found for: VALPROATE No results found for:  CBMZ  Screenings: GAD-7    Flowsheet Row Counselor from 09/09/2022 in [facility] Office Visit from 11/17/2021 in [facility] Primary Care & Sports Medicine at [facility] Office Visit from 05/28/2020 in [facility] Primary Care & Sports Medicine at [facility] Office Visit from 08/22/2019 in [facility] Primary Care & Sports Medicine at [facility] Office Visit from 12/15/2018 in [facility] Primary Care & Sports Medicine at [facility]  Total GAD-7 Score 16 4 0 0 6   PHQ2-9    Flowsheet Row Counselor from 12/28/2022 in [facility] Office Visit from 11/02/2022 in [facility] Counselor from 09/09/2022 in [facility] Office Visit from 11/17/2021 in [facility] Primary Care & Sports Medicine at [facility] Office Visit from 12/23/2020 in [facility] Primary Care & Sports Medicine at [facility]  PHQ-2 Total Score 1 4 3 1  0  PHQ-9 Total Score 4 8 7 1  0   Flowsheet Row Counselor from 09/09/2022 in [facility] ED from 12/06/2021 in [facility] Emergency Department at [facility] ED from 11/08/2021 in [facility] Emergency Department at [facility]  C-SSRS RISK CATEGORY No Risk No Risk No Risk    Collaboration of Care: Collaboration of Care:  Patient/Guardian was advised Release of Information must be obtained prior to any record release in order to collaborate their care with an outside provider. Patient/Guardian was advised if they have not already done so to contact the registration department to sign all necessary forms in order for us  to release information regarding their care.   Consent: Patient/Guardian gives verbal consent for treatment and assignment of benefits for services provided during this visit. Patient/Guardian expressed understanding and  agreed to proceed.   Prentice Espy, MD 02/03/2024, 8:58 AM

## 2024-02-27 ENCOUNTER — Ambulatory Visit (HOSPITAL_COMMUNITY): Admitting: Clinical

## 2024-03-09 ENCOUNTER — Other Ambulatory Visit: Payer: Self-pay

## 2024-03-09 ENCOUNTER — Ambulatory Visit (HOSPITAL_COMMUNITY): Admitting: Student

## 2024-03-09 DIAGNOSIS — F332 Major depressive disorder, recurrent severe without psychotic features: Secondary | ICD-10-CM

## 2024-03-09 MED ORDER — SERTRALINE HCL 100 MG PO TABS
200.0000 mg | ORAL_TABLET | Freq: Every day | ORAL | 0 refills | Status: DC
  Filled 2024-03-09: qty 180, 90d supply, fill #0

## 2024-03-09 MED ORDER — TRAZODONE HCL 150 MG PO TABS
75.0000 mg | ORAL_TABLET | Freq: Every evening | ORAL | 0 refills | Status: DC | PRN
  Filled 2024-03-09: qty 45, 90d supply, fill #0

## 2024-03-09 MED ORDER — SERTRALINE HCL 100 MG PO TABS
200.0000 mg | ORAL_TABLET | Freq: Every day | ORAL | 0 refills | Status: DC
  Filled 2024-03-09: qty 180, 90d supply, fill #0
  Filled 2024-04-30: qty 60, 30d supply, fill #0

## 2024-03-09 NOTE — Progress Notes (Signed)
 BH MD Outpatient Progress Note  03/09/2024 6:02 PM Adam Norton  MRN:  [id number]  Assessment:  Adam Norton presents for follow-up evaluation in-person. Today, 03/09/24, patient reports continuing to struggle with ongoing symptoms of MDD including irritability, sleep instability, and depressed mood. It has somewhat improved with restarting sertraline . Increasing sertraline  to 200 mg to aid with ongoing symptoms. May consider abilify to address irritability once we have maximized sertraline  dose. Patient has therapy scheduled with Paige in September.  Identifying Information: Patient is a 48 year old male with past psychiatric history of MDD and medical history of kidney stones and umbilical hernia who is an established patient with [facility] for medication management.   Plan: Major depressive disorder-recurrent episode, moderate without psychosis Past medication trials:  Status of problem: improving Interventions: -- Increase sertraline  to 200 mg -- Continue trazodone  75 mg nightly as needed for sleep -- Continue psychotherapy -- Referral to case manager for medicaid and any other financial resources  Insomnia 2/2 caffeine use -advise decreased caffeine use and stop caffeine intake by noon/early afternoon  Generalized Anxiety Disorder -zoloft  and psychotherapy as above  Nicotine Use Disorder Status: precontemplative -Recommend cessation -CXR no acute cardiopulmonary disease  Patient was given contact information for behavioral health clinic and was instructed to call 911 for emergencies.   Subjective:  Chief Complaint:  Medication Management  Interval History:  Patient presents as follow-up.  He reports positive news of obtaining a job that he starts next monday. He continues to struggle with irritability, anhedonia, and depressed mood. He has felt restarting sertraline  to be partially effective in addressing these symptoms.  He denies SI/HI/AVH.  He  was amenable to increasing sertraline .  Visit Diagnosis:    ICD-10-CM   1. Severe recurrent major depression without psychotic features (HCC)  F33.2 traZODone  (DESYREL ) 150 MG tablet    sertraline  (ZOLOFT ) 100 MG tablet    DISCONTINUED: sertraline  (ZOLOFT ) 100 MG tablet       Past Psychiatric Hx:  Previous Psych Diagnoses: MDD, history of cannabis abuse, nicotine dependence Prior inpatient treatment: Denies Current meds: None Psychotherapy hx: Got psychotherapy for 8 months in 2016 Previous suicidal attempts: Denies Previous medication trials: Zoloft  100 mg daily and trazodone  100 mg nightly Current therapist: Started therapy with [name]  Past Medical History:  Past Medical History:  Diagnosis Date   Alcohol abuse    Depression    Kidney stone    Kidney stone    Tobacco abuse     Past Surgical History:  Procedure Laterality Date   KIDNEY STONE SURGERY      Family History:  Family History  Problem Relation Age of Onset   Alcohol abuse Father    Diabetes Father    Cancer Father        pancreatic cancer   Alcohol abuse Brother    Colon cancer Neg Hx    Prostate cancer Neg Hx     Social History:  Social History   Socioeconomic History   Marital status: Single    Spouse name: Not on file   Number of children: Not on file   Years of education: Not on file   Highest education level: Not on file  Occupational History   Not on file  Tobacco Use   Smoking status: Every Day    Current packs/day: 1.00    Types: Cigarettes   Smokeless tobacco: Never  Vaping Use   Vaping status: Never Used  Substance and Sexual Activity   Alcohol use:  Not Currently    Comment: quit drinking in 2016.   Drug use: No   Sexual activity: Yes  Other Topics Concern   Not on file  Social History Narrative   Lives with his girlfriend   Social Drivers of Health   Financial Resource Strain: High Risk (09/09/2022)   Overall Financial Resource Strain (CARDIA)    Difficulty of  Paying Living Expenses: Very hard  Food Insecurity: Food Insecurity Present (09/09/2022)   Hunger Vital Sign    Worried About Running Out of Food in the Last Year: Sometimes true    Ran Out of Food in the Last Year: Sometimes true  Transportation Needs: No Transportation Needs (09/09/2022)   PRAPARE - Administrator, Civil Service (Medical): No    Lack of Transportation (Non-Medical): No  Physical Activity: Inactive (09/09/2022)   Exercise Vital Sign    Days of Exercise per Week: 0 days    Minutes of Exercise per Session: 0 min  Stress: Stress Concern Present (09/09/2022)   [employer] of Occupational Health - Occupational Stress Questionnaire    Feeling of Stress : Rather much  Social Connections: Moderately Isolated (09/09/2022)   Social Connection and Isolation Panel    Frequency of Communication with Friends and Family: Once a week    Frequency of Social Gatherings with Friends and Family: Never    Attends Religious Services: 1 to 4 times per year    Active Member of [employer] or Organizations: No    Attends Banker Meetings: Never    Marital Status: Living with partner    Allergies:  Allergies  Allergen Reactions   Seasonal Ic [Octacosanol]     Current Medications: Current Outpatient Medications  Medication Sig Dispense Refill   albuterol  (VENTOLIN  HFA) 108 (90 Base) MCG/ACT inhaler Inhale 2 puffs into the lungs every 6 (six) hours as needed for wheezing. (Patient not taking: Reported on 11/02/2022) 2 each 11   [START ON 04/02/2024] sertraline  (ZOLOFT ) 100 MG tablet Take 2 tablets (200 mg total) by mouth at bedtime. 180 tablet 0   [START ON 05/02/2024] traZODone  (DESYREL ) 150 MG tablet Take 0.5 tablets (75 mg total) by mouth at bedtime as needed for sleep. 45 tablet 0   No current facility-administered medications for this visit.    ROS: Review of Systems - denies any physical complaints  Objective:  Psychiatric Specialty Exam: There were no  vitals taken for this visit.There is no height or weight on file to calculate BMI.  General Appearance: Casual  Eye Contact:  Fair  Speech:  Clear and Coherent and Normal Rate  Volume:  Normal  Mood:  irritable  Affect:  constricted  Thought Process:  Coherent, Goal Directed, and Linear  Orientation:  Full (Time, Place, and Person)  Thought Content: Logical   Suicidal Thoughts:  No  Homicidal Thoughts:  No  Memory:  Remote;   Good  Judgment:  Good  Insight:  Good  Psychomotor Activity:  Normal  Concentration:  Concentration: Fair and Attention Span: Fair              Assets:  Communication Skills Desire for Improvement Housing Leisure Time Physical Health Resilience Social Support Talents/Skills Transportation Vocational/Educational  ADL's:  Intact  Cognition: WNL  Sleep:  improving   PE: General: well-appearing; no acute distress  Pulm: no increased work of breathing on room air  Strength & Muscle Tone: within normal limits Neuro: no focal neurological deficits observed  Gait & Station: normal  Metabolic Disorder Labs: Lab Results  Component Value Date   HGBA1C 5.5 11/02/2022   No results found for: PROLACTIN Lab Results  Component Value Date   CHOL 217 (H) 08/27/2016   TRIG 117 08/27/2016   HDL 35 (L) 08/27/2016   CHOLHDL 6.2 (H) 08/27/2016   VLDL 23 08/27/2016   LDLCALC 159 (H) 08/27/2016   LDLCALC 120 08/22/2015   Lab Results  Component Value Date   TSH 0.645 11/02/2022   TSH 0.437 08/22/2015    Therapeutic Level Labs: No results found for: LITHIUM No results found for: VALPROATE No results found for: CBMZ  Screenings: GAD-7    Advertising copywriter from 09/09/2022 in [facility] Office Visit from 11/17/2021 in [facility] Primary Care & Sports Medicine at [facility] Office Visit from 05/28/2020 in [facility] Care [facility] Sports Medicine at [facility] Office Visit from 08/22/2019  in [facility] Primary Care & Sports Medicine at [facility] Office Visit from 12/15/2018 in [facility] Primary Care & Sports Medicine at [facility]  Total GAD-7 Score 16 4 0 0 6   PHQ2-9    Flowsheet Row Counselor from 12/28/2022 in [facility] Office Visit from 11/02/2022 in [facility] Counselor from 09/09/2022 in [facility] Office Visit from 11/17/2021 in [facility] Primary Care & Sports Medicine at [facility] Office Visit from 12/23/2020 in [facility] Primary Care & Sports Medicine at [facility]  PHQ-2 Total Score 1 4 3 1  0  PHQ-9 Total Score 4 8 7 1  0   Flowsheet Row Counselor from 09/09/2022 in [facility] ED from 12/06/2021 in [facility] Emergency Department at [facility] ED from 11/08/2021 in [facility] Emergency Department at [facility]  C-SSRS RISK CATEGORY No Risk No Risk No Risk    Collaboration of Care: Collaboration of Care:  Patient/Guardian was advised Release of Information must be obtained prior to any record release in order to collaborate their care with an outside provider. Patient/Guardian was advised if they have not already done so to contact the registration department to sign all necessary forms in order for us  to release information regarding their care.   Consent: Patient/Guardian gives verbal consent for treatment and assignment of benefits for services provided during this visit. Patient/Guardian expressed understanding and agreed to proceed.   Prentice Espy, MD 03/09/2024, 6:02 PM

## 2024-03-20 ENCOUNTER — Ambulatory Visit (INDEPENDENT_AMBULATORY_CARE_PROVIDER_SITE_OTHER): Admitting: Clinical

## 2024-03-20 DIAGNOSIS — F331 Major depressive disorder, recurrent, moderate: Secondary | ICD-10-CM

## 2024-03-21 NOTE — Progress Notes (Signed)
   THERAPIST PROGRESS NOTE  Session Time: 40 min  Participation Level: Active  Behavioral Response: CasualAlertAnxious  Type of Therapy: Individual Therapy  Treatment Goals addressed: Client will engage in at least 80% of scheduled individual psychotherapy sessions  ProgressTowards Goals: Progressing  Interventions: CBT and Supportive  Summary:  Adam Norton is a 48 y.o. male who presents for the scheduled appointment oriented x 5, appropriately dressed, and cooperative.  Client denied hallucinations and delusions. Client reported there has been some good and not so good things.  Client reported within the past week he was able to secure a job.  Client reported it comes with good hours as well as benefits.  Client reported the work environment seems to be okay but he has only been there for few days so far.  Client reported the challenges that he is encountering is communication with his girlfriend.  Client reported he finds himself getting irritable, not telling her things in a timely manner that are important and at times he does yell.  Client reported he had his medications increased by the psychiatrist with seem to be somewhat helpful but he understands that the other part is changing his behaviors and how he is processing certain stressors.  Client reported he also has been homesick from his parents who are in [place]  as well as other family members.  Client reported he is coping with being at work now he can save up money to go see them sooner than later.  Client reported his parents are getting older and he wants to be able to see them but he does talk to them on the phone frequently. Evidence of progress towards goal: Client reported 1 positive of being employed which was a previous goal.   Suicidal/Homicidal: Nowithout intent/plan  Therapist Response:  Therapist began the appointment asking the client how he has been doing since last seen. Therapist engaged with active listening  and positive emotional support. Therapist used CBT to ask client to identify positive and negative changes that have occurred. Therapist used CBT to positively reinforce to his achievement of obtaining employment. Therapist used CBT to continue teaching with client about prioritization and stress management skills as well as communication skills. Therapist used CBT ask the client to identify his progress with frequency of use with coping skills with continued practice in his daily activity.    Therapist found client homework to practice the behavioral and communication skills discussed.  Plan: Return again in 4 weeks.  Diagnosis: MDD, recurrent, moderate with anxious distress  Collaboration of Care: Patient refused AEB none requested by the client.  Patient/Guardian was advised Release of Information must be obtained prior to any record release in order to collaborate their care with an outside provider. Patient/Guardian was advised if they have not already done so to contact the registration department to sign all necessary forms in order for us  to release information regarding their care.   Consent: Patient/Guardian gives verbal consent for treatment and assignment of benefits for services provided during this visit. Patient/Guardian expressed understanding and agreed to proceed.   Kandas Oliveto Y Romond Pipkins, LCSW 03/20/2024

## 2024-04-25 ENCOUNTER — Other Ambulatory Visit: Payer: Self-pay

## 2024-04-25 ENCOUNTER — Telehealth (HOSPITAL_COMMUNITY): Payer: Self-pay

## 2024-04-25 DIAGNOSIS — F332 Major depressive disorder, recurrent severe without psychotic features: Secondary | ICD-10-CM

## 2024-04-25 MED ORDER — TRAZODONE HCL 150 MG PO TABS
75.0000 mg | ORAL_TABLET | Freq: Every evening | ORAL | 0 refills | Status: DC | PRN
  Filled 2024-04-25: qty 45, 90d supply, fill #0
  Filled 2024-04-30: qty 15, 30d supply, fill #0

## 2024-04-25 NOTE — Telephone Encounter (Signed)
Pt is requesting refill

## 2024-04-25 NOTE — Addendum Note (Signed)
 Addended by: LYNNETTE BARTER B on: 04/25/2024 08:22 AM   Modules accepted: Orders

## 2024-04-30 ENCOUNTER — Other Ambulatory Visit: Payer: Self-pay

## 2024-05-04 ENCOUNTER — Other Ambulatory Visit: Payer: Self-pay

## 2024-05-04 ENCOUNTER — Ambulatory Visit (HOSPITAL_COMMUNITY): Admitting: Clinical

## 2024-05-09 ENCOUNTER — Ambulatory Visit (HOSPITAL_COMMUNITY): Admitting: Clinical

## 2024-05-09 DIAGNOSIS — F331 Major depressive disorder, recurrent, moderate: Secondary | ICD-10-CM

## 2024-05-09 NOTE — Progress Notes (Signed)
 Muhamad Serano is a 48 y.o. male patient presented to the appointment oriented times five, appropriately dressed and friendly. Client denied hallucinations and delusions. Client reported he is doing well and currently at work. Client reported his job is going well and he is happy he has benefits with insurance now so he can go to the doctor. Client reported he does have concerns because he is wondering if his girlfriend is using the money he gives her to pay bills. Client reported no other concerns.  **therapist and client did not speak for a billable amount of time.     Collaboration of Care: Patient refused AEB none requested.  Patient/Guardian was advised Release of Information must be obtained prior to any record release in order to collaborate their care with an outside provider. Patient/Guardian was advised if they have not already done so to contact the registration department to sign all necessary forms in order for us  to release information regarding their care.   Consent: Patient/Guardian gives verbal consent for treatment and assignment of benefits for services provided during this visit. Patient/Guardian expressed understanding and agreed to proceed.    Myca Perno Y [place] Current, LCSW

## 2024-05-11 ENCOUNTER — Encounter (HOSPITAL_COMMUNITY): Admitting: Student

## 2024-05-11 NOTE — Progress Notes (Deleted)
 BH MD Outpatient Progress Note  05/11/2024 7:42 AM Calder Oblinger  MRN:  [id number]  Assessment:  [name] presents for follow-up evaluation in-person. Today, 05/11/24, patient reports continuing to struggle with ongoing symptoms of MDD including irritability, sleep instability, and depressed mood. It has somewhat improved with restarting sertraline . Increasing sertraline  to 200 mg to aid with ongoing symptoms. May consider abilify to address irritability once we have maximized sertraline  dose. Patient has therapy scheduled with Paige in September.  Identifying Information: Patient is a 48 year old male with past psychiatric history of MDD and medical history of kidney stones and umbilical hernia who is an established patient with [facility] for medication management.   Plan: Major depressive disorder-recurrent episode, moderate without psychosis Past medication trials:  Status of problem: improving Interventions: -- Increase sertraline  to 200 mg -- Continue trazodone  75 mg nightly as needed for sleep -- Continue psychotherapy -- Referral to case manager for medicaid and any other financial resources  Insomnia 2/2 caffeine use -advise decreased caffeine use and stop caffeine intake by noon/early afternoon  Generalized Anxiety Disorder -zoloft  and psychotherapy as above  Nicotine Use Disorder Status: precontemplative -Recommend cessation -CXR no acute cardiopulmonary disease  Patient was given contact information for behavioral health clinic and was instructed to call 911 for emergencies.   Subjective:  Chief Complaint:  Medication Management  Interval History:  Patient presents as follow-up.  He reports positive news of obtaining a job that he starts next monday. He continues to struggle with irritability, anhedonia, and depressed mood. He has felt restarting sertraline  to be partially effective in addressing these symptoms.  He denies SI/HI/AVH.  He  was amenable to increasing sertraline .  Visit Diagnosis:  No diagnosis found.    Past Psychiatric Hx:  Previous Psych Diagnoses: MDD, history of cannabis abuse, nicotine dependence Prior inpatient treatment: Denies Current meds: None Psychotherapy hx: Got psychotherapy for 8 months in 2016 Previous suicidal attempts: Denies Previous medication trials: Zoloft  100 mg daily and trazodone  100 mg nightly Current therapist: Started therapy with [name]  Past Medical History:  Past Medical History:  Diagnosis Date   Alcohol abuse    Depression    Kidney stone    Kidney stone    Tobacco abuse     Past Surgical History:  Procedure Laterality Date   KIDNEY STONE SURGERY      Family History:  Family History  Problem Relation Age of Onset   Alcohol abuse Father    Diabetes Father    Cancer Father        pancreatic cancer   Alcohol abuse Brother    Colon cancer Neg Hx    Prostate cancer Neg Hx     Social History:  Social History   Socioeconomic History   Marital status: Single    Spouse name: Not on file   Number of children: Not on file   Years of education: Not on file   Highest education level: Not on file  Occupational History   Not on file  Tobacco Use   Smoking status: Every Day    Current packs/day: 1.00    Types: Cigarettes   Smokeless tobacco: Never  Vaping Use   Vaping status: Never Used  Substance and Sexual Activity   Alcohol use: Not Currently    Comment: quit drinking in 2016.   Drug use: No   Sexual activity: Yes  Other Topics Concern   Not on file  Social History Narrative   Lives with his  girlfriend   Social Drivers of Corporate investment banker Strain: High Risk (09/09/2022)   Overall Financial Resource Strain (CARDIA)    Difficulty of Paying Living Expenses: Very hard  Food Insecurity: Food Insecurity Present (09/09/2022)   Hunger Vital Sign    Worried About Running Out of Food in the Last Year: Sometimes true    Ran Out of Food  in the Last Year: Sometimes true  Transportation Needs: No Transportation Needs (09/09/2022)   PRAPARE - Administrator, Civil Service (Medical): No    Lack of Transportation (Non-Medical): No  Physical Activity: Inactive (09/09/2022)   Exercise Vital Sign    Days of Exercise per Week: 0 days    Minutes of Exercise per Session: 0 min  Stress: Stress Concern Present (09/09/2022)   [employer] of Occupational Health - Occupational Stress Questionnaire    Feeling of Stress : Rather much  Social Connections: Moderately Isolated (09/09/2022)   Social Connection and Isolation Panel    Frequency of Communication with Friends and Family: Once a week    Frequency of Social Gatherings with Friends and Family: Never    Attends Religious Services: 1 to 4 times per year    Active Member of [employer] or Organizations: No    Attends Banker Meetings: Never    Marital Status: Living with partner    Allergies:  Allergies  Allergen Reactions   Seasonal Ic [Octacosanol]     Current Medications: Current Outpatient Medications  Medication Sig Dispense Refill   albuterol  (VENTOLIN  HFA) 108 (90 Base) MCG/ACT inhaler Inhale 2 puffs into the lungs every 6 (six) hours as needed for wheezing. (Patient not taking: Reported on 11/02/2022) 2 each 11   sertraline  (ZOLOFT ) 100 MG tablet Take 2 tablets (200 mg total) by mouth at bedtime. 180 tablet 0   traZODone  (DESYREL ) 150 MG tablet Take 0.5 tablets (75 mg total) by mouth at bedtime as needed for sleep. 45 tablet 0   No current facility-administered medications for this visit.    ROS: Review of Systems - denies any physical complaints  Objective:  Psychiatric Specialty Exam: There were no vitals taken for this visit.There is no height or weight on file to calculate BMI.  General Appearance: Casual  Eye Contact:  Fair  Speech:  Clear and Coherent and Normal Rate  Volume:  Normal  Mood:  irritable  Affect:  constricted   Thought Process:  Coherent, Goal Directed, and Linear  Orientation:  Full (Time, Place, and Person)  Thought Content: Logical   Suicidal Thoughts:  No  Homicidal Thoughts:  No  Memory:  Remote;   Good  Judgment:  Good  Insight:  Good  Psychomotor Activity:  Normal  Concentration:  Concentration: Fair and Attention Span: Fair              Assets:  Communication Skills Desire for Improvement Housing Leisure Time Physical Health Resilience Social Support Talents/Skills Transportation Vocational/Educational  ADL's:  Intact  Cognition: WNL  Sleep:  improving   PE: General: well-appearing; no acute distress  Pulm: no increased work of breathing on room air  Strength & Muscle Tone: within normal limits Neuro: no focal neurological deficits observed  Gait & Station: normal  Metabolic Disorder Labs: Lab Results  Component Value Date   HGBA1C 5.5 11/02/2022   No results found for: PROLACTIN Lab Results  Component Value Date   CHOL 217 (H) 08/27/2016   TRIG 117 08/27/2016   HDL 35 (L) 08/27/2016  CHOLHDL 6.2 (H) 08/27/2016   VLDL 23 08/27/2016   LDLCALC 159 (H) 08/27/2016   LDLCALC 120 08/22/2015   Lab Results  Component Value Date   TSH 0.645 11/02/2022   TSH 0.437 08/22/2015    Therapeutic Level Labs: No results found for: LITHIUM No results found for: VALPROATE No results found for: CBMZ  Screenings: GAD-7    Flowsheet Row Counselor from 09/09/2022 in [facility] Office Visit from 11/17/2021 in [facility] Primary Care & Sports Medicine at [facility] Office Visit from 05/28/2020 in [facility] Primary Care & Sports Medicine at [facility] Office Visit from 08/22/2019 in [facility] Primary Care & Sports Medicine at [facility] Office Visit from 12/15/2018 in [facility] Primary Care & Sports Medicine at [facility]  Total GAD-7 Score 16 4 0 0 6   PHQ2-9    Flowsheet Row  Counselor from 12/28/2022 in [facility] Office Visit from 11/02/2022 in [facility] Counselor from 09/09/2022 in [facility] Office Visit from 11/17/2021 in [facility] Primary Care & Sports Medicine at [facility] Office Visit from 12/23/2020 in [facility] Primary Care & Sports Medicine at [facility]  PHQ-2 Total Score 1 4 3 1  0  PHQ-9 Total Score 4 8 7 1  0   Flowsheet Row Counselor from 09/09/2022 in [facility] ED from 12/06/2021 in [facility] Emergency Department at [facility] ED from 11/08/2021 in [facility] Emergency Department at [facility]  C-SSRS RISK CATEGORY No Risk No Risk No Risk    Collaboration of Care: Collaboration of Care:  Patient/Guardian was advised Release of Information must be obtained prior to any record release in order to collaborate their care with an outside provider. Patient/Guardian was advised if they have not already done so to contact the registration department to sign all necessary forms in order for us  to release information regarding their care.   Consent: Patient/Guardian gives verbal consent for treatment and assignment of benefits for services provided during this visit. Patient/Guardian expressed understanding and agreed to proceed.   Prentice Espy, MD 05/11/2024, 7:42 AM

## 2024-05-19 ENCOUNTER — Other Ambulatory Visit: Payer: Self-pay

## 2024-05-19 ENCOUNTER — Ambulatory Visit (INDEPENDENT_AMBULATORY_CARE_PROVIDER_SITE_OTHER)

## 2024-05-19 ENCOUNTER — Ambulatory Visit (HOSPITAL_COMMUNITY)
Admission: EM | Admit: 2024-05-19 | Discharge: 2024-05-19 | Disposition: A | Discharge status: Home / Self Care | Attending: Physician Assistant | Admitting: Physician Assistant

## 2024-05-19 ENCOUNTER — Encounter (HOSPITAL_COMMUNITY): Payer: Self-pay | Admitting: *Deleted

## 2024-05-19 DIAGNOSIS — R051 Acute cough: Secondary | ICD-10-CM

## 2024-05-19 DIAGNOSIS — J209 Acute bronchitis, unspecified: Secondary | ICD-10-CM

## 2024-05-19 MED ORDER — ALBUTEROL SULFATE HFA 108 (90 BASE) MCG/ACT IN AERS: 1.0000 | INHALATION_SPRAY | Freq: Four times a day (QID) | RESPIRATORY_TRACT | 0 refills | Status: AC | PRN

## 2024-05-19 MED ORDER — BENZONATATE 100 MG PO CAPS: 100.0000 mg | ORAL_CAPSULE | Freq: Three times a day (TID) | ORAL | 0 refills | Status: AC

## 2024-05-19 MED ORDER — AZITHROMYCIN 250 MG PO TABS: 250.0000 mg | ORAL_TABLET | Freq: Every day | ORAL | 0 refills | Status: AC

## 2024-05-19 NOTE — ED Notes (Signed)
 Pt declines to have Covid and influenza tests performed. Provider notified.

## 2024-05-19 NOTE — ED Triage Notes (Addendum)
 C/O postnasal drainage, sore throat, productive cough, chest congestion, sinus pressure, pressure in bilat ears onset 4 days ago. Unsure if fevers. Has been taking Mucinex  and Nighttime cough med and using Vicks Vapo Rub.

## 2024-05-19 NOTE — Discharge Instructions (Signed)
 Your chest xray does not show pneumonia, no acute findings.  Take antibiotic as prescribed Can take Tessalon  as needed for cough Can use inhaler as needed for shortness of breath or wheezing.  If no improvement or symptoms become worse return for evaluation

## 2024-05-19 NOTE — ED Provider Notes (Signed)
 [facility]    CSN: [id number] Arrival date & time: 05/19/24  1543      History   Chief Complaint Chief Complaint  Patient presents with   Sore Throat   Cough    HPI Adam Norton is a 48 y.o. male.   Patient presents with postnasal drip, sore throat, productive cough, chest congestion that started about 4 days ago.  He is unsure if he has had fevers at home but does report chills.  He has been taking Mucinex  and nighttime cold and cough medications.  He he reports using his partner's albuterol  inhaler with relief.  Denies shortness of breath or wheezing.  Patient is a smoker.    Past Medical History:  Diagnosis Date   Alcohol abuse    Depression    Kidney stone    Kidney stone    Tobacco abuse     Patient Active Problem List   Diagnosis Date Noted   Tobacco abuse counseling 11/02/2022   Unintentional weight loss of more than 10 pounds 11/02/2022   Screening for diabetes mellitus 11/02/2022   Severe recurrent major depression without psychotic features (HCC) 09/09/2022   Erectile dysfunction 01/08/2021   Chronic left hip pain 12/09/2020   Vaccine counseling 05/28/2020   Wheezing 07/05/2018   Umbilical hernia 11/29/2017   Cannabis abuse 07/03/2017   Poor dentition 05/20/2017   Nicotine dependence 04/12/2017   Nephrolithiasis 11/04/2016   Vitamin D  deficiency 08/23/2016   Arthropathy of cervical facet joint 01/13/2016   Caffeine-induced insomnia (HCC) 08/25/2015   Generalized anxiety disorder 08/18/2015   Major depression in complete remission 08/18/2015   Depression 08/18/2015    Past Surgical History:  Procedure Laterality Date   KIDNEY STONE SURGERY         Home Medications    Prior to Admission medications   Medication Sig Start Date End Date Taking? Authorizing Provider  albuterol  (VENTOLIN  HFA) 108 (90 Base) MCG/ACT inhaler Inhale 1-2 puffs into the lungs every 6 (six) hours as needed for wheezing or shortness of breath. 05/19/24  Yes  Ward, Harlene PEDLAR, PA-C  azithromycin  (ZITHROMAX  Z-PAK) 250 MG tablet Take 1 tablet (250 mg total) by mouth daily for 5 days. Take 2 tablets by mouth on the first day and then 1 tablet daily for remaining days. 05/19/24 05/24/24 Yes Ward, Harlene PEDLAR, PA-C  benzonatate  (TESSALON ) 100 MG capsule Take 1 capsule (100 mg total) by mouth every 8 (eight) hours. 05/19/24  Yes Ward, Harlene PEDLAR, PA-C  sertraline  (ZOLOFT ) 100 MG tablet Take 2 tablets (200 mg total) by mouth at bedtime. 04/02/24 07/01/24 Yes Lynnette Barter, MD  traZODone  (DESYREL ) 150 MG tablet Take 0.5 tablets (75 mg total) by mouth at bedtime as needed for sleep. 05/02/24 07/31/24 Yes Lynnette Barter, MD    Family History Family History  Problem Relation Age of Onset   Alcohol abuse Father    Diabetes Father    Cancer Father        pancreatic cancer   Alcohol abuse Brother    Colon cancer Neg Hx    Prostate cancer Neg Hx     Social History Social History   Tobacco Use   Smoking status: Every Day    Current packs/day: 1.00    Types: Cigarettes   Smokeless tobacco: Never  Vaping Use   Vaping status: Never Used  Substance Use Topics   Alcohol use: Not Currently    Comment: quit drinking in 2016.   Drug use: Never  Allergies   Seasonal ic [octacosanol]   Review of Systems Review of Systems  Constitutional:  Positive for chills.  HENT:  Positive for congestion and postnasal drip. Negative for ear pain and sore throat.   Eyes:  Negative for pain and visual disturbance.  Respiratory:  Positive for cough. Negative for shortness of breath.   Cardiovascular:  Negative for chest pain and palpitations.  Gastrointestinal:  Negative for abdominal pain and vomiting.  Genitourinary:  Negative for dysuria and hematuria.  Musculoskeletal:  Negative for arthralgias and back pain.  Skin:  Negative for color change and rash.  Neurological:  Negative for seizures and syncope.  All other systems reviewed and are negative.    Physical  Exam Triage Vital Signs ED Triage Vitals  Encounter Vitals Group     BP 05/19/24 1658 112/71     Girls Systolic BP Percentile --      Girls Diastolic BP Percentile --      Boys Systolic BP Percentile --      Boys Diastolic BP Percentile --      Pulse Rate 05/19/24 1658 (!) 54     Resp 05/19/24 1658 18     Temp 05/19/24 1658 98.1 F (36.7 C)     Temp Source 05/19/24 1658 Oral     SpO2 05/19/24 1658 98 %     Weight --      Height --      Head Circumference --      Peak Flow --      Pain Score 05/19/24 1700 3     Pain Loc --      Pain Education --      Exclude from Growth Chart --    No data found.  Updated Vital Signs BP 112/71   Pulse (!) 54   Temp 98.1 F (36.7 C) (Oral)   Resp 18   SpO2 98%   Visual Acuity Right Eye Distance:   Left Eye Distance:   Bilateral Distance:    Right Eye Near:   Left Eye Near:    Bilateral Near:     Physical Exam Vitals and nursing note reviewed.  Constitutional:      General: He is not in acute distress.    Appearance: He is well-developed.  HENT:     Head: Normocephalic and atraumatic.  Eyes:     Conjunctiva/sclera: Conjunctivae normal.  Cardiovascular:     Rate and Rhythm: Normal rate and regular rhythm.     Heart sounds: No murmur heard. Pulmonary:     Effort: Pulmonary effort is normal. No respiratory distress.     Breath sounds: Normal breath sounds.  Abdominal:     Palpations: Abdomen is soft.     Tenderness: There is no abdominal tenderness.  Musculoskeletal:        General: No swelling.     Cervical back: Neck supple.  Skin:    General: Skin is warm and dry.     Capillary Refill: Capillary refill takes less than 2 seconds.  Neurological:     Mental Status: He is alert.  Psychiatric:        Mood and Affect: Mood normal.      UC Treatments / Results  Labs (all labs ordered are listed, but only abnormal results are displayed) Labs Reviewed - No data to display  EKG   Radiology DG Chest 2 View Result  Date: 05/19/2024 EXAM: 2 VIEW(S) XRAY OF THE CHEST 05/19/2024 05:42:31 PM COMPARISON: 11/02/2022 CLINICAL HISTORY: cough,  shortness of breath FINDINGS: LUNGS AND PLEURA: No focal pulmonary opacity. No pulmonary edema. No pleural effusion. No pneumothorax. HEART AND MEDIASTINUM: No acute abnormality of the cardiac and mediastinal silhouettes. BONES AND SOFT TISSUES: No acute osseous abnormality. IMPRESSION: 1. No acute cardiopulmonary disease. Electronically signed by: Lynwood Seip MD 05/19/2024 05:45 PM EDT RP Workstation: [id number]    Procedures Procedures (including critical care time)  Medications Ordered in UC Medications - No data to display  Initial Impression / Assessment and Plan / UC Course  I have reviewed the triage vital signs and the nursing notes.  Pertinent labs & imaging results that were available during my care of the patient were reviewed by me and considered in my medical decision making (see chart for details).     Acute bronchitis.  Chest x-ray normal.  Will prescribe azithromycin , Tessalon  and inhaler.  Supportive care discussed.  Return precautions discussed. Final Clinical Impressions(s) / UC Diagnoses   Final diagnoses:  Acute cough  Acute bronchitis, unspecified organism     Discharge Instructions      Your chest xray does not show pneumonia, no acute findings.  Take antibiotic as prescribed Can take Tessalon  as needed for cough Can use inhaler as needed for shortness of breath or wheezing.  If no improvement or symptoms become worse return for evaluation    ED Prescriptions     Medication Sig Dispense Auth. Provider   azithromycin  (ZITHROMAX  Z-PAK) 250 MG tablet Take 1 tablet (250 mg total) by mouth daily for 5 days. Take 2 tablets by mouth on the first day and then 1 tablet daily for remaining days. 6 tablet Ward, Beckam Abdulaziz Z, PA-C   benzonatate  (TESSALON ) 100 MG capsule Take 1 capsule (100 mg total) by mouth every 8 (eight) hours. 21 capsule Ward,  Alfie Rideaux Z, PA-C   albuterol  (VENTOLIN  HFA) 108 (90 Base) MCG/ACT inhaler Inhale 1-2 puffs into the lungs every 6 (six) hours as needed for wheezing or shortness of breath. 1 each Ward, Harlene PEDLAR, PA-C      PDMP not reviewed this encounter.   Ward, Harlene PEDLAR, PA-C 05/19/24 1759

## 2024-05-25 ENCOUNTER — Other Ambulatory Visit: Payer: Self-pay

## 2024-05-25 ENCOUNTER — Ambulatory Visit (INDEPENDENT_AMBULATORY_CARE_PROVIDER_SITE_OTHER): Admitting: Student

## 2024-05-25 DIAGNOSIS — F332 Major depressive disorder, recurrent severe without psychotic features: Secondary | ICD-10-CM

## 2024-05-25 MED ORDER — TRAZODONE HCL 150 MG PO TABS
75.0000 mg | ORAL_TABLET | Freq: Every evening | ORAL | 1 refills | Status: AC | PRN
  Filled 2024-05-25: qty 15, 30d supply, fill #0
  Filled 2024-07-06: qty 15, 30d supply, fill #1
  Filled 2024-08-09: qty 15, 30d supply, fill #2

## 2024-05-25 MED ORDER — SERTRALINE HCL 100 MG PO TABS
200.0000 mg | ORAL_TABLET | Freq: Every day | ORAL | 1 refills | Status: AC
  Filled 2024-05-25: qty 60, 30d supply, fill #0
  Filled 2024-07-25: qty 60, 30d supply, fill #1
  Filled 2024-08-24: qty 60, 30d supply, fill #2

## 2024-05-25 NOTE — Progress Notes (Signed)
 [facility] MD Outpatient Progress Note  05/25/2024 12:59 PM Adam Norton  MRN:  [id number]  Assessment:  Adam Norton presents for follow-up evaluation in-person. Today, 05/25/24, patient reports overall stability in terms of MDD but does report some interpersonal difficulties between him and partner. Discussed couples counseling as well as having patient following up with PCP. Continuing ongoing psychotropics without change.   Identifying Information: Patient is a 48 year old male with past psychiatric history of MDD and medical history of kidney stones and umbilical hernia who is an established patient with [facility] for medication management.   Plan: Major depressive disorder-recurrent episode, moderate without psychosis Past medication trials:  Status of problem: improving Interventions: -- Continue sertraline  200 mg -- Continue trazodone  75 mg nightly as needed for sleep -- Continue psychotherapy  Insomnia 2/2 caffeine use   Generalized Anxiety Disorder -zoloft  and psychotherapy as above  Nicotine Use Disorder Status: precontemplative -Recommend cessation -CXR no acute cardiopulmonary disease  Patient was given contact information for behavioral health clinic and was instructed to call 911 for emergencies.   Subjective:  Chief Complaint:  Medication Management  Interval History:  Patient presents as follow-up.  He reports job is going well but notes struggling with irritability still although it appears to solely pertain to relationship between him and partner. He has felt sertraline  has been helpful for his ongoing symptoms.  He denies SI/HI/AVH.    Visit Diagnosis:    ICD-10-CM   1. Severe recurrent major depression without psychotic features (HCC)  F33.2 traZODone  (DESYREL ) 150 MG tablet    sertraline  (ZOLOFT ) 100 MG tablet        Past Psychiatric Hx:  Previous Psych Diagnoses: MDD, history of cannabis abuse, nicotine dependence Prior  inpatient treatment: Denies Current meds: None Psychotherapy hx: Got psychotherapy for 8 months in 2016 Previous suicidal attempts: Denies Previous medication trials: Zoloft  100 mg daily and trazodone  100 mg nightly Current therapist: Started therapy with Adam Norton  Past Medical History:  Past Medical History:  Diagnosis Date   Alcohol abuse    Depression    Kidney stone    Kidney stone    Tobacco abuse     Past Surgical History:  Procedure Laterality Date   KIDNEY STONE SURGERY      Family History:  Family History  Problem Relation Age of Onset   Alcohol abuse Father    Diabetes Father    Cancer Father        pancreatic cancer   Alcohol abuse Brother    Colon cancer Neg Hx    Prostate cancer Neg Hx     Social History:  Social History   Socioeconomic History   Marital status: Single    Spouse name: Not on file   Number of children: Not on file   Years of education: Not on file   Highest education level: Not on file  Occupational History   Not on file  Tobacco Use   Smoking status: Every Day    Current packs/day: 1.00    Types: Cigarettes   Smokeless tobacco: Never  Vaping Use   Vaping status: Never Used  Substance and Sexual Activity   Alcohol use: Not Currently    Comment: quit drinking in 2016.   Drug use: Never   Sexual activity: Not on file  Other Topics Concern   Not on file  Social History Narrative   Lives with his girlfriend   Social Drivers of Health   Financial Resource Strain: High Risk (09/09/2022)  Overall Financial Resource Strain (CARDIA)    Difficulty of Paying Living Expenses: Very hard  Food Insecurity: Food Insecurity Present (09/09/2022)   Hunger Vital Sign    Worried About Running Out of Food in the Last Year: Sometimes true    Ran Out of Food in the Last Year: Sometimes true  Transportation Needs: No Transportation Needs (09/09/2022)   PRAPARE - Administrator, Civil Service (Medical): No    Lack of  Transportation (Non-Medical): No  Physical Activity: Inactive (09/09/2022)   Exercise Vital Sign    Days of Exercise per Week: 0 days    Minutes of Exercise per Session: 0 min  Stress: Stress Concern Present (09/09/2022)   [employer] of Occupational Health - Occupational Stress Questionnaire    Feeling of Stress : Rather much  Social Connections: Moderately Isolated (09/09/2022)   Social Connection and Isolation Panel    Frequency of Communication with Friends and Family: Once a week    Frequency of Social Gatherings with Friends and Family: Never    Attends Religious Services: 1 to 4 times per year    Active Member of [employer] or Organizations: No    Attends Banker Meetings: Never    Marital Status: Living with partner    Allergies:  Allergies  Allergen Reactions   Seasonal Ic [Octacosanol]     Current Medications: Current Outpatient Medications  Medication Sig Dispense Refill   albuterol  (VENTOLIN  HFA) 108 (90 Base) MCG/ACT inhaler Inhale 1-2 puffs into the lungs every 6 (six) hours as needed for wheezing or shortness of breath. 1 each 0   benzonatate  (TESSALON ) 100 MG capsule Take 1 capsule (100 mg total) by mouth every 8 (eight) hours. 21 capsule 0   sertraline  (ZOLOFT ) 100 MG tablet Take 2 tablets (200 mg total) by mouth at bedtime. 180 tablet 1   traZODone  (DESYREL ) 150 MG tablet Take 0.5 tablets (75 mg total) by mouth at bedtime as needed for sleep. 45 tablet 1   No current facility-administered medications for this visit.    ROS: Review of Systems - denies any physical complaints  Objective:  Psychiatric Specialty Exam: There were no vitals taken for this visit.There is no height or weight on file to calculate BMI.  General Appearance: Casual  Eye Contact:  Fair  Speech:  Clear and Coherent and Normal Rate  Volume:  Normal  Mood:  irritable  Affect:  constricted  Thought Process:  Coherent, Goal Directed, and Linear  Orientation:  Full (Time,  Place, and Person)  Thought Content: Logical   Suicidal Thoughts:  No  Homicidal Thoughts:  No  Memory:  Remote;   Good  Judgment:  Good  Insight:  Good  Psychomotor Activity:  Normal  Concentration:  Concentration: Fair and Attention Span: Fair              Assets:  Communication Skills Desire for Improvement Housing Leisure Time Physical Health Resilience Social Support Talents/Skills Transportation Vocational/Educational  ADL's:  Intact  Cognition: WNL  Sleep:  improving   PE: General: well-appearing; no acute distress  Pulm: no increased work of breathing on room air  Strength & Muscle Tone: within normal limits Neuro: no focal neurological deficits observed  Gait & Station: normal  Metabolic Disorder Labs: Lab Results  Component Value Date   HGBA1C 5.5 11/02/2022   No results found for: PROLACTIN Lab Results  Component Value Date   CHOL 217 (H) 08/27/2016   TRIG 117 08/27/2016   HDL  35 (L) 08/27/2016   CHOLHDL 6.2 (H) 08/27/2016   VLDL 23 08/27/2016   LDLCALC 159 (H) 08/27/2016   LDLCALC 120 08/22/2015   Lab Results  Component Value Date   TSH 0.645 11/02/2022   TSH 0.437 08/22/2015    Therapeutic Level Labs: No results found for: LITHIUM No results found for: VALPROATE No results found for: CBMZ  Screenings: GAD-7    Flowsheet Row Counselor from 09/09/2022 in [facility] Office Visit from 11/17/2021 in [facility] Primary Care & Sports Medicine at [facility] Office Visit from 05/28/2020 in [facility] Primary Care & Sports Medicine at [facility] Office Visit from 08/22/2019 in [facility] Primary Care & Sports Medicine at [facility] Office Visit from 12/15/2018 in [facility] Primary Care & Sports Medicine at [facility]  Total GAD-7 Score 16 4 0 0 6   PHQ2-9    Flowsheet Row Counselor from 12/28/2022 in [facility] Office Visit from  11/02/2022 in [facility] Counselor from 09/09/2022 in [facility] Office Visit from 11/17/2021 in [facility] Primary Care & Sports Medicine at [facility] Office Visit from 12/23/2020 in [facility] Primary Care & Sports Medicine at [facility]  PHQ-2 Total Score 1 4 3 1  0  PHQ-9 Total Score 4 8 7 1  0   Flowsheet Row UC from 05/19/2024 in [facility] at [facility] from 09/09/2022 in [facility] ED from 12/06/2021 in [facility] Emergency Department at [facility]  C-SSRS RISK CATEGORY No Risk No Risk No Risk    Collaboration of Care: Collaboration of Care:  Patient/Guardian was advised Release of Information must be obtained prior to any record release in order to collaborate their care with an outside provider. Patient/Guardian was advised if they have not already done so to contact the registration department to sign all necessary forms in order for us  to release information regarding their care.   Consent: Patient/Guardian gives verbal consent for treatment and assignment of benefits for services provided during this visit. Patient/Guardian expressed understanding and agreed to proceed.   Prentice Espy, MD 05/25/2024, 12:59 PM

## 2024-06-01 ENCOUNTER — Other Ambulatory Visit: Payer: Self-pay

## 2024-07-06 ENCOUNTER — Other Ambulatory Visit: Payer: Self-pay

## 2024-07-25 ENCOUNTER — Other Ambulatory Visit: Payer: Self-pay

## 2024-08-24 ENCOUNTER — Other Ambulatory Visit: Payer: Self-pay

## 2024-08-24 ENCOUNTER — Ambulatory Visit (HOSPITAL_COMMUNITY): Admitting: Student

## 2024-08-24 DIAGNOSIS — F3342 Major depressive disorder, recurrent, in full remission: Secondary | ICD-10-CM

## 2024-08-24 NOTE — Progress Notes (Cosign Needed Addendum)
 BH MD Outpatient Progress Note  08/24/2024 10:03 AM Adam Norton  MRN:  [id number]  Assessment:  Adam Norton presents for follow-up evaluation in-person. Today, 08/24/24, patient reports overall stability in terms of MDD and recent interpersonal stressors have been improving. Discussed couples counseling as well as having patient following up with PCP due to concerns for sexual dysfunction. Sertraline  likely contributing to this but patient currently interested in keeping ongoing medications dosages given improvements in mood. Discussed options to aid including switching to a different SSRI/SNRI, adjunct treatment with bupropion/mirtazapine, couples counseling, and mood.  Continuing ongoing psychotropics without change for now. Also discussed patient following up with Paige for psychotherapy.  Identifying Information: Patient is a 49 year old male with past psychiatric history of MDD and medical history of kidney stones and umbilical hernia who is an established patient with [facility] for medication management.   Plan: Major depressive disorder-recurrent episode, moderate without psychosis Past medication trials:  Status of problem: improving Interventions: -- Continue sertraline  200 mg -- Continue trazodone  75 mg nightly as needed for sleep -- Continue psychotherapy  Insomnia 2/2 caffeine use   Generalized Anxiety Disorder -zoloft  and psychotherapy as above  Nicotine Use Disorder Status: precontemplative -Recommend cessation -CXR no acute cardiopulmonary disease  Patient was given contact information for behavioral health clinic and was instructed to call 911 for emergencies.   Subjective:  Chief Complaint:  Medication Management  Interval History:  Patient presents as follow-up.  He reports job is going well and irritability has lessened recently although still present. He has felt sertraline  has been helpful for his ongoing symptoms although expresses  some problems with erectile dysfunction. He declined changes to psychotropics for now. He denies SI/HI/AVH.    Visit Diagnosis:  No diagnosis found.     Past Psychiatric Hx:  Previous Psych Diagnoses: MDD, history of cannabis abuse, nicotine dependence Prior inpatient treatment: Denies Current meds: None Psychotherapy hx: Got psychotherapy for 8 months in 2016 Previous suicidal attempts: Denies Previous medication trials: Zoloft  100 mg daily and trazodone  100 mg nightly Current therapist: Started therapy with [name]  Past Medical History:  Past Medical History:  Diagnosis Date   Alcohol abuse    Depression    Kidney stone    Kidney stone    Tobacco abuse     Past Surgical History:  Procedure Laterality Date   KIDNEY STONE SURGERY      Family History:  Family History  Problem Relation Age of Onset   Alcohol abuse Father    Diabetes Father    Cancer Father        pancreatic cancer   Alcohol abuse Brother    Colon cancer Neg Hx    Prostate cancer Neg Hx     Social History:  Social History   Socioeconomic History   Marital status: Single    Spouse name: Not on file   Number of children: Not on file   Years of education: Not on file   Highest education level: Not on file  Occupational History   Not on file  Tobacco Use   Smoking status: Every Day    Current packs/day: 1.00    Types: Cigarettes   Smokeless tobacco: Never  Vaping Use   Vaping status: Never Used  Substance and Sexual Activity   Alcohol use: Not Currently    Comment: quit drinking in 2016.   Drug use: Never   Sexual activity: Not on file  Other Topics Concern   Not on file  Social History Narrative   Lives with his girlfriend   Social Drivers of Health   Tobacco Use: High Risk (05/19/2024)   Patient History    Smoking Tobacco Use: Every Day    Smokeless Tobacco Use: Never    Passive Exposure: Not on file  Financial Resource Strain: High Risk (09/09/2022)   Overall Financial  Resource Strain (CARDIA)    Difficulty of Paying Living Expenses: Very hard  Food Insecurity: Food Insecurity Present (09/09/2022)   Hunger Vital Sign    Worried About Running Out of Food in the Last Year: Sometimes true    Ran Out of Food in the Last Year: Sometimes true  Transportation Needs: No Transportation Needs (09/09/2022)   PRAPARE - Administrator, Civil Service (Medical): No    Lack of Transportation (Non-Medical): No  Physical Activity: Inactive (09/09/2022)   Exercise Vital Sign    Days of Exercise per Week: 0 days    Minutes of Exercise per Session: 0 min  Stress: Stress Concern Present (09/09/2022)   [employer] of Occupational Health - Occupational Stress Questionnaire    Feeling of Stress : Rather much  Social Connections: Moderately Isolated (09/09/2022)   Social Connection and Isolation Panel    Frequency of Communication with Friends and Family: Once a week    Frequency of Social Gatherings with Friends and Family: Never    Attends Religious Services: 1 to 4 times per year    Active Member of Clubs or Organizations: No    Attends Banker Meetings: Never    Marital Status: Living with partner  Depression (PHQ2-9): Low Risk (12/28/2022)   Depression (PHQ2-9)    PHQ-2 Score: 4  Recent Concern: Depression (PHQ2-9) - Medium Risk (11/02/2022)   Depression (PHQ2-9)    PHQ-2 Score: 8  Alcohol Screen: Low Risk (09/09/2022)   Alcohol Screen    Last Alcohol Screening Score (AUDIT): 0  Housing: Medium Risk (09/09/2022)   Housing    Last Housing Risk Score: 1  Utilities: At Risk (09/09/2022)   AHC Utilities    Threatened with loss of utilities: Yes  Health Literacy: Not on file    Allergies:  Allergies  Allergen Reactions   Seasonal Ic [Octacosanol]     Current Medications: Current Outpatient Medications  Medication Sig Dispense Refill   albuterol  (VENTOLIN  HFA) 108 (90 Base) MCG/ACT inhaler Inhale 1-2 puffs into the lungs every 6 (six)  hours as needed for wheezing or shortness of breath. 1 each 0   benzonatate  (TESSALON ) 100 MG capsule Take 1 capsule (100 mg total) by mouth every 8 (eight) hours. 21 capsule 0   sertraline  (ZOLOFT ) 100 MG tablet Take 2 tablets (200 mg total) by mouth at bedtime. 180 tablet 1   traZODone  (DESYREL ) 150 MG tablet Take 0.5 tablets (75 mg total) by mouth at bedtime as needed for sleep. 45 tablet 1   No current facility-administered medications for this visit.    ROS: Review of Systems - denies any physical complaints  Objective:  Psychiatric Specialty Exam: There were no vitals taken for this visit.There is no height or weight on file to calculate BMI.  General Appearance: Casual  Eye Contact:  Fair  Speech:  Clear and Coherent and Normal Rate  Volume:  Normal  Mood:  irritable  Affect:  constricted  Thought Process:  Coherent, Goal Directed, and Linear  Orientation:  Full (Time, Place, and Person)  Thought Content: Logical   Suicidal Thoughts:  No  Homicidal Thoughts:  No  Memory:  Remote;   Good  Judgment:  Good  Insight:  Good  Psychomotor Activity:  Normal  Concentration:  Concentration: Fair and Attention Span: Fair              Assets:  Communication Skills Desire for Improvement Housing Leisure Time Physical Health Resilience Social Support Talents/Skills Transportation Vocational/Educational  ADL's:  Intact  Cognition: WNL  Sleep:  improving   PE: General: well-appearing; no acute distress  Pulm: no increased work of breathing on room air  Strength & Muscle Tone: within normal limits Neuro: no focal neurological deficits observed  Gait & Station: normal  Metabolic Disorder Labs: Lab Results  Component Value Date   HGBA1C 5.5 11/02/2022   No results found for: PROLACTIN Lab Results  Component Value Date   CHOL 217 (H) 08/27/2016   TRIG 117 08/27/2016   HDL 35 (L) 08/27/2016   CHOLHDL 6.2 (H) 08/27/2016   VLDL 23 08/27/2016   LDLCALC 159 (H)  08/27/2016   LDLCALC 120 08/22/2015   Lab Results  Component Value Date   TSH 0.645 11/02/2022   TSH 0.437 08/22/2015    Therapeutic Level Labs: No results found for: LITHIUM No results found for: VALPROATE No results found for: CBMZ  Screenings: GAD-7    Flowsheet Row Counselor from 09/09/2022 in [facility] Office Visit from 11/17/2021 in [facility] Primary Care & [facility] Medicine at [facility] Office Visit from 05/28/2020 in [facility] Primary Care & Sports Medicine at [facility] Office Visit from 08/22/2019 in [facility] Primary Care & Sports Medicine at [facility] Office Visit from 12/15/2018 in [facility] Primary Care & Sports Medicine at [facility]  Total GAD-7 Score 16 4 0 0 6   PHQ2-9    Flowsheet Row Counselor from 12/28/2022 in [facility] Office Visit from 11/02/2022 in [facility] Counselor from 09/09/2022 in [facility] Office Visit from 11/17/2021 in [facility] Primary Care & Sports Medicine at [facility] Office Visit from 12/23/2020 in [facility] Primary Care & Sports Medicine at [facility]  PHQ-2 Total Score 1 4 3 1  0  PHQ-9 Total Score 4 8 7 1  0   Flowsheet Row UC from 05/19/2024 in [facility] at [facility] from 09/09/2022 in [facility] ED from 12/06/2021 in [facility] Department at [facility]  C-SSRS RISK CATEGORY No Risk No Risk No Risk    Collaboration of Care: Collaboration of Care:  Patient/Guardian was advised Release of Information must be obtained prior to any record release in order to collaborate their care with an outside provider. Patient/Guardian was advised if they have not already done so to contact the registration department to sign all necessary forms in order for us   to release information regarding their care.   Consent: Patient/Guardian gives verbal consent for treatment and assignment of benefits for services provided during this visit. Patient/Guardian expressed understanding and agreed to proceed.   Prentice Espy, MD 08/24/2024, 10:03 AM

## 2024-09-14 ENCOUNTER — Ambulatory Visit (HOSPITAL_COMMUNITY): Admitting: Clinical

## 2024-11-02 ENCOUNTER — Encounter (HOSPITAL_COMMUNITY): Admitting: Student
